# Patient Record
Sex: Female | Born: 1955 | Race: White | Hispanic: No | Marital: Single | Smoking: Former smoker
Health system: Southern US, Community
[De-identification: ages and names within clinical notes are randomized; demographics above are authoritative.]

## PROBLEM LIST (undated history)

## (undated) DIAGNOSIS — C44722 Squamous cell carcinoma of skin of right lower limb, including hip: Secondary | ICD-10-CM

## (undated) DIAGNOSIS — E785 Hyperlipidemia, unspecified: Secondary | ICD-10-CM

## (undated) DIAGNOSIS — M5430 Sciatica, unspecified side: Secondary | ICD-10-CM

## (undated) HISTORY — DX: Squamous cell carcinoma of skin of right lower limb, including hip: C44.722

## (undated) HISTORY — PX: GYNECOLOGIC CRYOSURGERY: SHX857

## (undated) HISTORY — PX: CERVICAL BIOPSY  W/ LOOP ELECTRODE EXCISION: SUR135

## (undated) HISTORY — DX: Hyperlipidemia, unspecified: E78.5

## (undated) HISTORY — DX: Sciatica, unspecified side: M54.30

---

## 1999-09-16 ENCOUNTER — Encounter: Admission: RE | Admit: 1999-09-16 | Discharge: 1999-09-16 | Payer: Self-pay | Admitting: Obstetrics and Gynecology

## 1999-09-16 ENCOUNTER — Encounter: Payer: Self-pay | Admitting: Obstetrics and Gynecology

## 2000-09-19 ENCOUNTER — Encounter: Admission: RE | Admit: 2000-09-19 | Discharge: 2000-09-19 | Payer: Self-pay | Admitting: Obstetrics and Gynecology

## 2000-09-19 ENCOUNTER — Encounter: Payer: Self-pay | Admitting: Obstetrics and Gynecology

## 2001-09-29 ENCOUNTER — Encounter: Admission: RE | Admit: 2001-09-29 | Discharge: 2001-09-29 | Payer: Self-pay | Admitting: Family Medicine

## 2001-09-29 ENCOUNTER — Encounter: Payer: Self-pay | Admitting: Family Medicine

## 2002-10-02 ENCOUNTER — Encounter: Admission: RE | Admit: 2002-10-02 | Discharge: 2002-10-02 | Payer: Self-pay | Admitting: Obstetrics and Gynecology

## 2002-10-02 ENCOUNTER — Encounter: Payer: Self-pay | Admitting: Obstetrics and Gynecology

## 2003-10-08 ENCOUNTER — Encounter: Admission: RE | Admit: 2003-10-08 | Discharge: 2003-10-08 | Payer: Self-pay | Admitting: Obstetrics and Gynecology

## 2004-10-21 ENCOUNTER — Encounter: Admission: RE | Admit: 2004-10-21 | Discharge: 2004-10-21 | Payer: Self-pay | Admitting: Family Medicine

## 2005-11-04 ENCOUNTER — Encounter: Admission: RE | Admit: 2005-11-04 | Discharge: 2005-11-04 | Payer: Self-pay | Admitting: Obstetrics and Gynecology

## 2006-11-21 ENCOUNTER — Encounter: Admission: RE | Admit: 2006-11-21 | Discharge: 2006-11-21 | Payer: Self-pay | Admitting: Obstetrics and Gynecology

## 2007-11-28 ENCOUNTER — Encounter: Admission: RE | Admit: 2007-11-28 | Discharge: 2007-11-28 | Payer: Self-pay | Admitting: Obstetrics and Gynecology

## 2008-04-10 ENCOUNTER — Ambulatory Visit: Payer: Self-pay | Admitting: Internal Medicine

## 2008-11-28 ENCOUNTER — Encounter: Admission: RE | Admit: 2008-11-28 | Discharge: 2008-11-28 | Payer: Self-pay | Admitting: Family Medicine

## 2009-12-03 ENCOUNTER — Encounter: Admission: RE | Admit: 2009-12-03 | Discharge: 2009-12-03 | Payer: Self-pay | Admitting: Family Medicine

## 2010-11-03 ENCOUNTER — Other Ambulatory Visit: Payer: Self-pay | Admitting: Family Medicine

## 2010-11-03 DIAGNOSIS — Z1231 Encounter for screening mammogram for malignant neoplasm of breast: Secondary | ICD-10-CM

## 2010-12-08 ENCOUNTER — Ambulatory Visit
Admission: RE | Admit: 2010-12-08 | Discharge: 2010-12-08 | Disposition: A | Discharge status: Home / Self Care | Payer: BC Managed Care – PPO | Source: Ambulatory Visit | Attending: Family Medicine | Admitting: Family Medicine

## 2010-12-08 DIAGNOSIS — Z1231 Encounter for screening mammogram for malignant neoplasm of breast: Secondary | ICD-10-CM

## 2011-09-27 ENCOUNTER — Other Ambulatory Visit: Payer: Self-pay | Admitting: Family Medicine

## 2011-09-27 DIAGNOSIS — Z1231 Encounter for screening mammogram for malignant neoplasm of breast: Secondary | ICD-10-CM

## 2011-11-26 ENCOUNTER — Other Ambulatory Visit: Payer: Self-pay | Admitting: Family Medicine

## 2011-11-26 DIAGNOSIS — Z780 Asymptomatic menopausal state: Secondary | ICD-10-CM

## 2011-12-15 ENCOUNTER — Ambulatory Visit: Payer: BC Managed Care – PPO

## 2011-12-16 ENCOUNTER — Ambulatory Visit: Payer: BC Managed Care – PPO

## 2011-12-17 ENCOUNTER — Other Ambulatory Visit: Payer: BC Managed Care – PPO

## 2011-12-22 ENCOUNTER — Ambulatory Visit
Admission: RE | Admit: 2011-12-22 | Discharge: 2011-12-22 | Disposition: A | Discharge status: Home / Self Care | Payer: BC Managed Care – PPO | Source: Ambulatory Visit | Attending: Family Medicine | Admitting: Family Medicine

## 2011-12-22 ENCOUNTER — Other Ambulatory Visit: Payer: BC Managed Care – PPO

## 2011-12-22 DIAGNOSIS — Z1231 Encounter for screening mammogram for malignant neoplasm of breast: Secondary | ICD-10-CM

## 2012-01-06 ENCOUNTER — Ambulatory Visit
Admission: RE | Admit: 2012-01-06 | Discharge: 2012-01-06 | Disposition: A | Discharge status: Home / Self Care | Payer: BC Managed Care – PPO | Source: Ambulatory Visit | Attending: Family Medicine | Admitting: Family Medicine

## 2012-01-06 DIAGNOSIS — Z780 Asymptomatic menopausal state: Secondary | ICD-10-CM

## 2012-10-31 ENCOUNTER — Other Ambulatory Visit: Payer: Self-pay

## 2012-10-31 DIAGNOSIS — Z1231 Encounter for screening mammogram for malignant neoplasm of breast: Secondary | ICD-10-CM

## 2012-12-27 ENCOUNTER — Ambulatory Visit
Admission: RE | Admit: 2012-12-27 | Discharge: 2012-12-27 | Disposition: A | Discharge status: Home / Self Care | Payer: BC Managed Care – PPO | Source: Ambulatory Visit

## 2012-12-27 DIAGNOSIS — Z1231 Encounter for screening mammogram for malignant neoplasm of breast: Secondary | ICD-10-CM

## 2013-11-20 ENCOUNTER — Other Ambulatory Visit: Payer: Self-pay

## 2013-11-20 DIAGNOSIS — Z1231 Encounter for screening mammogram for malignant neoplasm of breast: Secondary | ICD-10-CM

## 2013-12-28 ENCOUNTER — Ambulatory Visit: Payer: BC Managed Care – PPO

## 2013-12-31 ENCOUNTER — Ambulatory Visit: Payer: BC Managed Care – PPO

## 2014-01-10 ENCOUNTER — Ambulatory Visit
Admission: RE | Admit: 2014-01-10 | Discharge: 2014-01-10 | Disposition: A | Discharge status: Home / Self Care | Payer: BC Managed Care – PPO | Source: Ambulatory Visit

## 2014-01-10 ENCOUNTER — Encounter (INDEPENDENT_AMBULATORY_CARE_PROVIDER_SITE_OTHER): Payer: Self-pay

## 2014-01-10 DIAGNOSIS — Z1231 Encounter for screening mammogram for malignant neoplasm of breast: Secondary | ICD-10-CM

## 2014-05-11 DIAGNOSIS — E7800 Pure hypercholesterolemia, unspecified: Secondary | ICD-10-CM | POA: Insufficient documentation

## 2015-02-06 ENCOUNTER — Other Ambulatory Visit: Payer: Self-pay

## 2015-02-06 DIAGNOSIS — Z1231 Encounter for screening mammogram for malignant neoplasm of breast: Secondary | ICD-10-CM

## 2015-02-17 ENCOUNTER — Ambulatory Visit
Admission: RE | Admit: 2015-02-17 | Discharge: 2015-02-17 | Disposition: A | Discharge status: Home / Self Care | Payer: 59 | Source: Ambulatory Visit

## 2015-02-17 DIAGNOSIS — Z1231 Encounter for screening mammogram for malignant neoplasm of breast: Secondary | ICD-10-CM

## 2016-02-02 ENCOUNTER — Other Ambulatory Visit: Payer: Self-pay | Admitting: Family Medicine

## 2016-02-02 DIAGNOSIS — Z1231 Encounter for screening mammogram for malignant neoplasm of breast: Secondary | ICD-10-CM

## 2016-02-24 ENCOUNTER — Ambulatory Visit: Payer: Self-pay

## 2016-03-01 ENCOUNTER — Ambulatory Visit
Admission: RE | Admit: 2016-03-01 | Discharge: 2016-03-01 | Disposition: A | Discharge status: Home / Self Care | Payer: BLUE CROSS/BLUE SHIELD | Source: Ambulatory Visit | Attending: Family Medicine | Admitting: Family Medicine

## 2016-03-01 DIAGNOSIS — Z1231 Encounter for screening mammogram for malignant neoplasm of breast: Secondary | ICD-10-CM

## 2016-06-01 DIAGNOSIS — Z23 Encounter for immunization: Secondary | ICD-10-CM | POA: Diagnosis not present

## 2016-07-05 DIAGNOSIS — Z131 Encounter for screening for diabetes mellitus: Secondary | ICD-10-CM | POA: Diagnosis not present

## 2016-07-05 DIAGNOSIS — Z13220 Encounter for screening for lipoid disorders: Secondary | ICD-10-CM | POA: Diagnosis not present

## 2016-07-05 DIAGNOSIS — Z0000 Encounter for general adult medical examination without abnormal findings: Secondary | ICD-10-CM | POA: Diagnosis not present

## 2016-09-16 DIAGNOSIS — H25813 Combined forms of age-related cataract, bilateral: Secondary | ICD-10-CM | POA: Diagnosis not present

## 2016-10-07 DIAGNOSIS — D225 Melanocytic nevi of trunk: Secondary | ICD-10-CM | POA: Diagnosis not present

## 2016-10-07 DIAGNOSIS — L853 Xerosis cutis: Secondary | ICD-10-CM | POA: Diagnosis not present

## 2016-10-07 DIAGNOSIS — D1801 Hemangioma of skin and subcutaneous tissue: Secondary | ICD-10-CM | POA: Diagnosis not present

## 2016-10-07 DIAGNOSIS — L821 Other seborrheic keratosis: Secondary | ICD-10-CM | POA: Diagnosis not present

## 2016-11-16 DIAGNOSIS — Z13220 Encounter for screening for lipoid disorders: Secondary | ICD-10-CM | POA: Diagnosis not present

## 2016-11-22 DIAGNOSIS — Z23 Encounter for immunization: Secondary | ICD-10-CM | POA: Diagnosis not present

## 2016-12-24 DIAGNOSIS — C44722 Squamous cell carcinoma of skin of right lower limb, including hip: Secondary | ICD-10-CM

## 2016-12-24 HISTORY — DX: Squamous cell carcinoma of skin of right lower limb, including hip: C44.722

## 2017-01-25 ENCOUNTER — Other Ambulatory Visit: Payer: Self-pay | Admitting: Family Medicine

## 2017-01-25 DIAGNOSIS — R5381 Other malaise: Secondary | ICD-10-CM

## 2017-01-31 ENCOUNTER — Other Ambulatory Visit: Payer: Self-pay | Admitting: Family Medicine

## 2017-01-31 DIAGNOSIS — E2839 Other primary ovarian failure: Secondary | ICD-10-CM

## 2017-01-31 DIAGNOSIS — Z1231 Encounter for screening mammogram for malignant neoplasm of breast: Secondary | ICD-10-CM

## 2017-02-03 DIAGNOSIS — L989 Disorder of the skin and subcutaneous tissue, unspecified: Secondary | ICD-10-CM | POA: Diagnosis not present

## 2017-02-16 DIAGNOSIS — C44722 Squamous cell carcinoma of skin of right lower limb, including hip: Secondary | ICD-10-CM | POA: Diagnosis not present

## 2017-02-16 DIAGNOSIS — D225 Melanocytic nevi of trunk: Secondary | ICD-10-CM | POA: Diagnosis not present

## 2017-03-08 ENCOUNTER — Ambulatory Visit
Admission: RE | Admit: 2017-03-08 | Discharge: 2017-03-08 | Disposition: A | Discharge status: Home / Self Care | Payer: BLUE CROSS/BLUE SHIELD | Source: Ambulatory Visit | Attending: Family Medicine | Admitting: Family Medicine

## 2017-03-08 DIAGNOSIS — Z13820 Encounter for screening for osteoporosis: Secondary | ICD-10-CM | POA: Diagnosis not present

## 2017-03-08 DIAGNOSIS — E2839 Other primary ovarian failure: Secondary | ICD-10-CM

## 2017-03-08 DIAGNOSIS — Z1231 Encounter for screening mammogram for malignant neoplasm of breast: Secondary | ICD-10-CM | POA: Diagnosis not present

## 2017-03-08 DIAGNOSIS — Z780 Asymptomatic menopausal state: Secondary | ICD-10-CM | POA: Diagnosis not present

## 2017-03-23 DIAGNOSIS — Z23 Encounter for immunization: Secondary | ICD-10-CM | POA: Diagnosis not present

## 2017-05-05 ENCOUNTER — Encounter: Payer: Self-pay | Admitting: Obstetrics & Gynecology

## 2017-05-19 ENCOUNTER — Ambulatory Visit (INDEPENDENT_AMBULATORY_CARE_PROVIDER_SITE_OTHER): Payer: BLUE CROSS/BLUE SHIELD | Admitting: Obstetrics & Gynecology

## 2017-05-19 ENCOUNTER — Encounter: Payer: Self-pay | Admitting: Obstetrics & Gynecology

## 2017-05-19 ENCOUNTER — Other Ambulatory Visit (HOSPITAL_COMMUNITY)
Admission: RE | Admit: 2017-05-19 | Discharge: 2017-05-19 | Disposition: A | Discharge status: Home / Self Care | Payer: BLUE CROSS/BLUE SHIELD | Source: Ambulatory Visit | Attending: Obstetrics & Gynecology | Admitting: Obstetrics & Gynecology

## 2017-05-19 VITALS — BP 110/70 | HR 82 | Resp 16 | Ht 63.25 in | Wt 140.0 lb

## 2017-05-19 DIAGNOSIS — Z01419 Encounter for gynecological examination (general) (routine) without abnormal findings: Secondary | ICD-10-CM

## 2017-05-19 DIAGNOSIS — Z124 Encounter for screening for malignant neoplasm of cervix: Secondary | ICD-10-CM

## 2017-05-19 DIAGNOSIS — Z23 Encounter for immunization: Secondary | ICD-10-CM | POA: Diagnosis not present

## 2017-05-19 NOTE — Progress Notes (Signed)
61 y.o. [id number] SingleCaucasianF here for new patient annual exam.  PMP, no HRT.  Hasn't cycled in at least five years.  Saw Dr. Kris Mouton in [facility] before he retired.  Denies vaginal bleeding.  PCP: Dr. Tamala Julian.  Last appt was in February  No LMP recorded. Patient is postmenopausal.          Sexually active: Yes.    The current method of family planning is post menopausal status.    Exercising: No.  walking, yoga Smoker:  no  Health Maintenance: Pap: 11/2013 Neg. HR HPV:neg  - Care Everywhere (pt thought she had Pap done more recently) History of abnormal Pap:  Yes, LEEP  MMG:  03/08/17 BIRADS1:neg  Colonoscopy:  2008? Normal  BMD:  03/08/17 Normal  TDaP:  2013? Pneumonia vaccine(s):  No Shingrix:  4/30 and 7/14 Hep C testing: No Screening Labs: PCP   reports that she quit smoking about 36 years ago. Her smoking use included Cigarettes. She has never used smokeless tobacco. She reports that she does not drink alcohol.  Past Medical History:  Diagnosis Date  . 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 CERVICAL BIOPSY  W/ LOOP ELECTRODE EXCISION    . GYNECOLOGIC CRYOSURGERY      Current Outpatient Prescriptions  Medication Sig Dispense Refill  . Co-Enzyme Q-10 30 MG CAPS Take 30 mg by mouth daily.    . Red Yeast Rice Extract (RED YEAST RICE PO) Take by mouth daily.     No current facility-administered medications for this visit.     Family History  Problem Relation Age of Onset  . Heart attack Mother   . Stroke Father   . Breast cancer Neg Hx     ROS:  Pertinent items are noted in HPI.  Otherwise, a comprehensive ROS was negative.  Exam:   BP 110/70 (BP Location: Right Arm, Patient Position: Sitting, Cuff Size: Normal)   Pulse 82   Resp 16   Ht 5' 3.25" (1.607 m)   Wt 140 lb (63.5 kg)   BMI 24.60 kg/m  Height: 5' 3.25" (160.7 cm)  Ht Readings from Last 3 Encounters:  05/19/17 5' 3.25" (1.607 m)     General appearance: alert, cooperative and appears stated age Head: Normocephalic, without obvious abnormality, atraumatic Neck: no adenopathy, supple, symmetrical, trachea midline and thyroid normal to inspection and palpation Lungs: clear to auscultation bilaterally Breasts: normal appearance, no masses or tenderness Heart: regular rate and rhythm Abdomen: soft, non-tender; bowel sounds normal; no masses,  no organomegaly Extremities: extremities normal, atraumatic, no cyanosis or edema Skin: Skin color, texture, turgor normal. No rashes or lesions Lymph nodes: Cervical, supraclavicular, and axillary nodes normal. No abnormal inguinal nodes palpated Neurologic: Grossly normal   Pelvic: External genitalia:  no lesions              Urethra:  normal appearing urethra with no masses, tenderness or lesions              Bartholins and Skenes: normal                 Vagina: normal appearing vagina with normal color and discharge, no lesions              Cervix: no lesions              Pap taken: Yes.   Bimanual Exam:  Uterus:  normal size, contour, position, consistency, mobility,  non-tender              Adnexa: normal adnexa and no mass, fullness, tenderness               Rectovaginal: Confirms               Anus:  normal sphincter tone, no lesions  Chaperone was present for exam.  A:  Well Woman with normal exam PMP, no HRT Elevated lipids, declined statin treatment currently  P:   Mammogram guidelines reviewed.  3D recommended.   pap smear and HR HPV obtained today Release of records for last lab work and vaccines will be signed Pt is going to check about colonoscopy as if her dates are correct, she is due this year Return annually or prn

## 2017-05-24 LAB — CYTOLOGY - PAP
Diagnosis: UNDETERMINED — AB
HPV: NOT DETECTED

## 2017-05-26 ENCOUNTER — Telehealth: Payer: Self-pay | Admitting: *Deleted

## 2017-05-26 NOTE — Telephone Encounter (Signed)
Patient returned call. Results reviewed with patient as seen below from Dr. Sabra Heck. Patient has aex scheduled for 08/17/18.   Patient agreeable to disposition. Will close encounter.

## 2017-05-26 NOTE — Telephone Encounter (Signed)
-----   Message from Megan Salon, MD sent at 05/25/2017  6:46 AM EDT ----- Please call pt and let her know her pap was ascus with negative HR HPV and that although this doesn't sound normal, it actually is a normal pap smear.  I will repeat this next year and not skip a year just to be conservative with following her.  She is on my chart but desires phone calls about her results.  02 recall.  Thanks.

## 2017-05-26 NOTE — Telephone Encounter (Signed)
Message left to return call to Cyndel Griffey at [phone].    

## 2017-06-30 DIAGNOSIS — E7800 Pure hypercholesterolemia, unspecified: Secondary | ICD-10-CM | POA: Diagnosis not present

## 2017-06-30 DIAGNOSIS — Z0000 Encounter for general adult medical examination without abnormal findings: Secondary | ICD-10-CM | POA: Diagnosis not present

## 2017-07-22 DIAGNOSIS — H40053 Ocular hypertension, bilateral: Secondary | ICD-10-CM | POA: Diagnosis not present

## 2017-08-01 DIAGNOSIS — J410 Simple chronic bronchitis: Secondary | ICD-10-CM | POA: Diagnosis not present

## 2017-08-01 DIAGNOSIS — H6522 Chronic serous otitis media, left ear: Secondary | ICD-10-CM | POA: Diagnosis not present

## 2017-08-01 DIAGNOSIS — J322 Chronic ethmoidal sinusitis: Secondary | ICD-10-CM | POA: Diagnosis not present

## 2017-08-01 DIAGNOSIS — J320 Chronic maxillary sinusitis: Secondary | ICD-10-CM | POA: Diagnosis not present

## 2017-08-01 DIAGNOSIS — H90A32 Mixed conductive and sensorineural hearing loss, unilateral, left ear with restricted hearing on the contralateral side: Secondary | ICD-10-CM | POA: Diagnosis not present

## 2017-08-01 DIAGNOSIS — H90A21 Sensorineural hearing loss, unilateral, right ear, with restricted hearing on the contralateral side: Secondary | ICD-10-CM | POA: Diagnosis not present

## 2017-08-12 DIAGNOSIS — H9012 Conductive hearing loss, unilateral, left ear, with unrestricted hearing on the contralateral side: Secondary | ICD-10-CM | POA: Diagnosis not present

## 2017-08-12 DIAGNOSIS — H6982 Other specified disorders of Eustachian tube, left ear: Secondary | ICD-10-CM | POA: Diagnosis not present

## 2017-11-17 DIAGNOSIS — E7800 Pure hypercholesterolemia, unspecified: Secondary | ICD-10-CM | POA: Diagnosis not present

## 2017-11-24 DIAGNOSIS — D2239 Melanocytic nevi of other parts of face: Secondary | ICD-10-CM | POA: Diagnosis not present

## 2017-11-24 DIAGNOSIS — D2261 Melanocytic nevi of right upper limb, including shoulder: Secondary | ICD-10-CM | POA: Diagnosis not present

## 2017-11-24 DIAGNOSIS — Z85828 Personal history of other malignant neoplasm of skin: Secondary | ICD-10-CM | POA: Diagnosis not present

## 2017-11-24 DIAGNOSIS — D225 Melanocytic nevi of trunk: Secondary | ICD-10-CM | POA: Diagnosis not present

## 2018-01-16 DIAGNOSIS — J209 Acute bronchitis, unspecified: Secondary | ICD-10-CM | POA: Diagnosis not present

## 2018-01-30 ENCOUNTER — Telehealth: Payer: Self-pay | Admitting: Obstetrics & Gynecology

## 2018-01-30 NOTE — Telephone Encounter (Signed)
Patient thinks she may have an infection °

## 2018-01-30 NOTE — Telephone Encounter (Signed)
Spoke with patient. Reports intermittent, thin, watery d/c. Symptoms started last week while in [place]. No new products. Denies any other GYN symptoms.  Recommended OV for further evaluation, OV scheduled for 4/9 at 9am with Dr. Sabra Heck.   Routing to provider for final review. Patient is agreeable to disposition. Will close encounter.

## 2018-01-31 ENCOUNTER — Telehealth: Payer: Self-pay | Admitting: Obstetrics & Gynecology

## 2018-01-31 ENCOUNTER — Encounter: Payer: Self-pay | Admitting: Obstetrics & Gynecology

## 2018-01-31 ENCOUNTER — Other Ambulatory Visit: Payer: Self-pay

## 2018-01-31 ENCOUNTER — Ambulatory Visit (INDEPENDENT_AMBULATORY_CARE_PROVIDER_SITE_OTHER): Payer: BLUE CROSS/BLUE SHIELD | Admitting: Obstetrics & Gynecology

## 2018-01-31 VITALS — BP 120/60 | HR 88 | Resp 14 | Ht 63.25 in | Wt 140.8 lb

## 2018-01-31 DIAGNOSIS — Z205 Contact with and (suspected) exposure to viral hepatitis: Secondary | ICD-10-CM

## 2018-01-31 DIAGNOSIS — N898 Other specified noninflammatory disorders of vagina: Secondary | ICD-10-CM

## 2018-01-31 NOTE — Progress Notes (Signed)
GYNECOLOGY  VISIT  CC:   Possible vaginitis  HPI: 62 y.o. [phone] Single Caucasian female here for complaint of copious vaginal discharge that started while she was on a [place] cruise about two weeks ago.  She did have some pads with her on the trip and needed to use pads because the discharge was so watery.  She's not had any bleeding.  At times, she wondered if was urine but she's fairly confident that she is not having urinary leakage.    She took Amoxicillin for 10 days before her [place] cruise due to a URI.  Reports she has to come back for Hep C testing.  Insurance requires this to be a separate appt.  Orders placed.  GYNECOLOGIC HISTORY: No LMP recorded. Patient is postmenopausal. Contraception: post menopausal  Menopausal hormone therapy: none  Patient Active Problem List   Diagnosis Date Noted  . Hypercholesteremia 05/11/2014    Past Medical History:  Diagnosis Date  . 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 CERVICAL BIOPSY  W/ LOOP ELECTRODE EXCISION    . GYNECOLOGIC CRYOSURGERY      MEDS:   Current Outpatient Medications on File Prior to Visit  Medication Sig Dispense Refill  . Co-Enzyme Q-10 30 MG CAPS Take 30 mg by mouth daily.    . Multiple Vitamin (MULTIVITAMIN) capsule Take by mouth daily.    . Red Yeast Rice Extract (RED YEAST RICE PO) Take by mouth daily.     No current facility-administered medications on file prior to visit.     ALLERGIES: Patient has no known allergies.  Family History  Problem Relation Age of Onset  . Heart attack Mother   . Stroke Father   . Breast cancer Neg Hx     SH:  Married, non smoker  Review of Systems  Genitourinary:       Abnormal discharge Loss of urine spontaneously  Loss of urine with sneeze or cough   All other systems reviewed and are negative.   PHYSICAL EXAMINATION:    BP 120/60 (BP Location: Right Arm, Patient Position: Sitting, Cuff  Size: Normal)   Pulse 88   Resp 14   Ht 5' 3.25" (1.607 m)   Wt 140 lb 12.8 oz (63.9 kg)   BMI 24.74 kg/m     General appearance: alert, cooperative and appears stated age Lymph:  No inguinal LAD  Pelvic: External genitalia:  no lesions              Urethra:  normal appearing urethra with no masses, tenderness or lesions              Bartholins and Skenes: normal                 Vagina: normal appearing vagina with normal color and discharge, no lesions              Cervix: no lesions              Bimanual Exam:  Uterus:  normal size, contour, position, consistency, mobility, non-tender              Adnexa: no mass, fullness, tenderness.              Anus:  normal sphincter tone  Chaperone was present for exam.  Assessment: Vaginal discharge  Plan: Affirm testing pending.  She wants to wait for the results before proceeding with treatment. Hep C  antibody ordered.  Her insurance requires her to return for just lab work.

## 2018-01-31 NOTE — Telephone Encounter (Signed)
Patient declined to schedule her lab appointment for Hep C anti-body at checkout today. She said she will call to schedule.  Routing to provider for FYI. Will keep encounter open to monitor scheduling.

## 2018-02-01 ENCOUNTER — Telehealth: Payer: Self-pay | Admitting: Obstetrics & Gynecology

## 2018-02-01 DIAGNOSIS — N898 Other specified noninflammatory disorders of vagina: Secondary | ICD-10-CM

## 2018-02-01 LAB — VAGINITIS/VAGINOSIS, DNA PROBE
CANDIDA SPECIES: NEGATIVE
Gardnerella vaginalis: NEGATIVE
Trichomonas vaginosis: NEGATIVE

## 2018-02-01 NOTE — Telephone Encounter (Signed)
Spoke with patient. Patient requesting vaginitis test results dated 7/9, is leaving to go out of town.   Advised patient negative for yeast, BV and trichomonas. Dr. Sabra Heck still needs to review and make final recommendations. Advised Dr. Sabra Heck is out of the office today, will return on 7/11. Our office will return call once reviewed. Patient agreeable.   Dr. Sabra Heck -please advise.

## 2018-02-01 NOTE — Telephone Encounter (Signed)
Spoke with patient, advised as seen below per Dr. Sabra Heck. PUS scheduled for 02/09/18 at 2pm, consult to follow at 2:30pm with Dr. Sabra Heck. Patient verbalizes understanding and is agreeable.   Order placed for PUS.   Encounter closed.

## 2018-02-01 NOTE — Telephone Encounter (Signed)
We discussed at the [facility] that she should proceed with a PUS if the vaginitis testing was negative.  She is going to [facility] so will need to do this when she returns.  Thanks.

## 2018-02-01 NOTE — Telephone Encounter (Signed)
Patient is calling for her recent lab results. Patient was seen yesterday and "is going out of town and would like to pick up her medication".

## 2018-02-07 ENCOUNTER — Other Ambulatory Visit: Payer: Self-pay | Admitting: Obstetrics & Gynecology

## 2018-02-07 DIAGNOSIS — Z1231 Encounter for screening mammogram for malignant neoplasm of breast: Secondary | ICD-10-CM

## 2018-02-09 ENCOUNTER — Ambulatory Visit (INDEPENDENT_AMBULATORY_CARE_PROVIDER_SITE_OTHER): Payer: BLUE CROSS/BLUE SHIELD

## 2018-02-09 ENCOUNTER — Ambulatory Visit (INDEPENDENT_AMBULATORY_CARE_PROVIDER_SITE_OTHER): Payer: BLUE CROSS/BLUE SHIELD | Admitting: Obstetrics & Gynecology

## 2018-02-09 VITALS — BP 130/80 | HR 92 | Resp 16 | Ht 63.25 in | Wt 140.0 lb

## 2018-02-09 DIAGNOSIS — N898 Other specified noninflammatory disorders of vagina: Secondary | ICD-10-CM

## 2018-02-09 NOTE — Progress Notes (Signed)
62 y.o. [phone] Single Caucasian female here for pelvic ultrasound due to copious amount of clear vaginal discharge that occurred while she was in [place] on a riverboat cruise.  Vaginitis testing was negative.  As this dicharge was significant, I felt PUS was appropriate.  Discharge has significantly improved.   She was recently in [place] and has just received notice that she may have been exposed to a bacteria while there.  This notification came from the hotel.  Up to date recommendations given.  No prophylaxis is recommended but signs/symptoms reviewed.  Incubation period reviewed as well..  No LMP recorded. Patient is postmenopausal.  Contraception: PMP  Findings:  UTERUS: 5.3 x 3.9 x 2.6cm EMS:2.52mm ADNEXA: Left ovary:  1.1 x 0.9 x 0.6cm       Right ovary: 1.6 x 1.0 x 0.7cm (ovaries were difficult to visualize due to overlying bowel gas) CUL DE SAC:  No free fluid noted  Discussion:  Findings reviewed.  Images reviewed.  As symptoms are much better, do not feel she needs anything at this time for treatment.  She is going to call if has new onset of symptoms.  As well, in regards to possible bacteria exposure, she will monitor for signs and symptoms and call with any concerns..  Assessment:  Vaginal discharge that seems to have resolved  Plan:  Pt will call with any new concerns/symptoms.   ~15 minutes spent with patient >50% of time was in face to face discussion of above.

## 2018-02-12 ENCOUNTER — Encounter: Payer: Self-pay | Admitting: Obstetrics & Gynecology

## 2018-03-13 ENCOUNTER — Ambulatory Visit
Admission: RE | Admit: 2018-03-13 | Discharge: 2018-03-13 | Disposition: A | Discharge status: Home / Self Care | Payer: BLUE CROSS/BLUE SHIELD | Source: Ambulatory Visit | Attending: Obstetrics & Gynecology | Admitting: Obstetrics & Gynecology

## 2018-03-13 ENCOUNTER — Ambulatory Visit: Payer: BLUE CROSS/BLUE SHIELD

## 2018-03-13 DIAGNOSIS — Z1231 Encounter for screening mammogram for malignant neoplasm of breast: Secondary | ICD-10-CM

## 2018-03-30 ENCOUNTER — Telehealth: Payer: Self-pay | Admitting: Obstetrics & Gynecology

## 2018-03-30 NOTE — Telephone Encounter (Signed)
Patient has some questions about screening for hepatitis.

## 2018-03-30 NOTE — Telephone Encounter (Signed)
Pt would like to schedule hep C screening. Per patient Insurance does not cover this screening with an office visit. Needs lab visit only.  Orders have been placed by Dr. Sabra Heck.  Lab appointment scheduled for tomorrow morning, not fasting.  Encounter closed.

## 2018-03-31 ENCOUNTER — Other Ambulatory Visit (INDEPENDENT_AMBULATORY_CARE_PROVIDER_SITE_OTHER): Payer: BLUE CROSS/BLUE SHIELD

## 2018-03-31 DIAGNOSIS — Z205 Contact with and (suspected) exposure to viral hepatitis: Secondary | ICD-10-CM

## 2018-04-01 LAB — HEPATITIS C ANTIBODY: Hep C Virus Ab: 0.1 s/co ratio (ref 0.0–0.9)

## 2018-06-26 ENCOUNTER — Encounter: Payer: Self-pay | Admitting: Obstetrics & Gynecology

## 2018-06-26 ENCOUNTER — Ambulatory Visit (INDEPENDENT_AMBULATORY_CARE_PROVIDER_SITE_OTHER): Payer: BLUE CROSS/BLUE SHIELD | Admitting: Obstetrics & Gynecology

## 2018-06-26 ENCOUNTER — Other Ambulatory Visit (HOSPITAL_COMMUNITY)
Admission: RE | Admit: 2018-06-26 | Discharge: 2018-06-26 | Disposition: A | Discharge status: Home / Self Care | Payer: BLUE CROSS/BLUE SHIELD | Source: Ambulatory Visit | Attending: Obstetrics & Gynecology | Admitting: Obstetrics & Gynecology

## 2018-06-26 ENCOUNTER — Encounter

## 2018-06-26 VITALS — BP 136/74 | HR 88 | Resp 16 | Ht 63.0 in | Wt 142.2 lb

## 2018-06-26 DIAGNOSIS — Z124 Encounter for screening for malignant neoplasm of cervix: Secondary | ICD-10-CM | POA: Insufficient documentation

## 2018-06-26 DIAGNOSIS — Z01419 Encounter for gynecological examination (general) (routine) without abnormal findings: Secondary | ICD-10-CM

## 2018-06-26 NOTE — Progress Notes (Signed)
62 y.o. [id number] Single White or Caucasian female here for annual exam.  Denies vaginal bleeding.  Doing GI vitamin regimen.  Doing well with vitamin regimen.  Urinary function is normal.    Did get a flu shot.    PCP:  Having blood work done with Dr. Tamala Julian.  No LMP recorded. Patient is postmenopausal.          Sexually active: No.  The current method of family planning is post menopausal status.    Exercising: Yes.    walk Smoker:  no  Health Maintenance: Pap:  05/19/17 ASCUS. HR HPV:neg  History of abnormal Pap:  Yes, LEEP MMG:  03/13/18 BIRADS1:neg  Colonoscopy:  2008?  She is going to check with insurance about what practice is in her system.   BMD:   03/08/17 Normal  TDaP:  2013 Pneumonia vaccine(s):  No Shingrix:   Completed  Hep C testing: 03/31/18 Neg  Screening Labs: PCP   reports that she quit smoking about 37 years ago. Her smoking use included cigarettes. She has never used smokeless tobacco. She reports that she drinks about 5.0 standard drinks of alcohol per week. She reports that she does not use drugs.  Past Medical History:  Diagnosis Date  . 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 CERVICAL BIOPSY  W/ LOOP ELECTRODE EXCISION    . GYNECOLOGIC CRYOSURGERY      Current Outpatient Medications  Medication Sig Dispense Refill  . Co-Enzyme Q-10 30 MG CAPS Take 30 mg by mouth daily.    . diphenhydrAMINE HCl (ALLERGY MED PO) Take by mouth daily.    . fluticasone (FLONASE) 50 MCG/ACT nasal spray Place into both nostrils daily.    . Magnesium 400 MG CAPS Take by mouth daily.    . Multiple Vitamin (MULTIVITAMIN) capsule Take by mouth daily.    . Omega-3 Fatty Acids (KP FISH OIL) 1200 MG CAPS Take by mouth daily.     No current facility-administered medications for this visit.     Family History  Problem Relation Age of Onset  . Heart attack Mother   . Stroke Father   . Breast cancer Neg Hx     Review  of Systems  Genitourinary:       Loss of urine with sneeze or cough  Loss of urine spontaneously   All other systems reviewed and are negative.   Exam:   BP 136/74 (BP Location: Right Arm, Patient Position: Sitting, Cuff Size: Normal)   Pulse 88   Resp 16   Ht 5\' 3"  (1.6 m)   Wt 142 lb 3.2 oz (64.5 kg)   BMI 25.19 kg/m    Height: 5\' 3"  (160 cm)  Ht Readings from Last 3 Encounters:  06/26/18 5\' 3"  (1.6 m)  02/09/18 5' 3.25" (1.607 m)  01/31/18 5' 3.25" (1.607 m)    General appearance: alert, cooperative and appears stated age Head: Normocephalic, without obvious abnormality, atraumatic Neck: no adenopathy, supple, symmetrical, trachea midline and thyroid normal to inspection and palpation Lungs: clear to auscultation bilaterally Breasts: normal appearance, no masses or tenderness Heart: regular rate and rhythm Abdomen: soft, non-tender; bowel sounds normal; no masses,  no organomegaly Extremities: extremities normal, atraumatic, no cyanosis or edema Skin: Skin color, texture, turgor normal. No rashes or lesions Lymph nodes: Cervical, supraclavicular, and axillary nodes normal. No abnormal inguinal nodes palpated Neurologic: Grossly normal   Pelvic: External genitalia:  no lesions  Urethra:  normal appearing urethra with no masses, tenderness or lesions              Bartholins and Skenes: normal                 Vagina: normal appearing vagina with normal color and discharge, no lesions              Cervix: no lesions              Pap taken: Yes.   Bimanual Exam:  Uterus:  normal size, contour, position, consistency, mobility, non-tender              Adnexa: normal adnexa and no mass, fullness, tenderness               Rectovaginal: Confirms               Anus:  normal sphincter tone, no lesions  Chaperone was present for exam.  A:  Well Woman with normal exam PMP, on HRT H/o elevated lipids, followed by Dr. Tamala Julian  P:   Mammogram guidelines reviewed.   Doing 3D.  pap smear obtained today Aware colonoscopy is due.  She is going to check who is in-network for her and let me know Will see Dr Tamala Julian next week and have blood work done.  Vaccines are UTD. Return annually or prn

## 2018-06-28 LAB — CYTOLOGY - PAP: DIAGNOSIS: NEGATIVE

## 2018-07-03 DIAGNOSIS — E7800 Pure hypercholesterolemia, unspecified: Secondary | ICD-10-CM | POA: Diagnosis not present

## 2018-07-03 DIAGNOSIS — Z0000 Encounter for general adult medical examination without abnormal findings: Secondary | ICD-10-CM | POA: Diagnosis not present

## 2018-08-17 ENCOUNTER — Ambulatory Visit: Payer: BLUE CROSS/BLUE SHIELD | Admitting: Obstetrics & Gynecology

## 2018-08-17 DIAGNOSIS — Z01818 Encounter for other preprocedural examination: Secondary | ICD-10-CM | POA: Diagnosis not present

## 2018-08-17 DIAGNOSIS — Z1211 Encounter for screening for malignant neoplasm of colon: Secondary | ICD-10-CM | POA: Diagnosis not present

## 2018-09-04 DIAGNOSIS — S83242A Other tear of medial meniscus, current injury, left knee, initial encounter: Secondary | ICD-10-CM | POA: Diagnosis not present

## 2018-09-04 DIAGNOSIS — M2342 Loose body in knee, left knee: Secondary | ICD-10-CM | POA: Diagnosis not present

## 2018-09-08 DIAGNOSIS — M25562 Pain in left knee: Secondary | ICD-10-CM | POA: Diagnosis not present

## 2018-09-27 DIAGNOSIS — E7800 Pure hypercholesterolemia, unspecified: Secondary | ICD-10-CM | POA: Diagnosis not present

## 2018-10-12 DIAGNOSIS — K5730 Diverticulosis of large intestine without perforation or abscess without bleeding: Secondary | ICD-10-CM | POA: Diagnosis not present

## 2018-10-12 DIAGNOSIS — D124 Benign neoplasm of descending colon: Secondary | ICD-10-CM | POA: Diagnosis not present

## 2018-10-12 DIAGNOSIS — Z1211 Encounter for screening for malignant neoplasm of colon: Secondary | ICD-10-CM | POA: Diagnosis not present

## 2018-10-12 DIAGNOSIS — D125 Benign neoplasm of sigmoid colon: Secondary | ICD-10-CM | POA: Diagnosis not present

## 2018-10-12 DIAGNOSIS — D123 Benign neoplasm of transverse colon: Secondary | ICD-10-CM | POA: Diagnosis not present

## 2018-10-17 DIAGNOSIS — D124 Benign neoplasm of descending colon: Secondary | ICD-10-CM | POA: Diagnosis not present

## 2018-10-17 DIAGNOSIS — D125 Benign neoplasm of sigmoid colon: Secondary | ICD-10-CM | POA: Diagnosis not present

## 2018-10-17 DIAGNOSIS — D123 Benign neoplasm of transverse colon: Secondary | ICD-10-CM | POA: Diagnosis not present

## 2018-12-27 DIAGNOSIS — L738 Other specified follicular disorders: Secondary | ICD-10-CM | POA: Diagnosis not present

## 2018-12-27 DIAGNOSIS — Z85828 Personal history of other malignant neoplasm of skin: Secondary | ICD-10-CM | POA: Diagnosis not present

## 2018-12-27 DIAGNOSIS — D225 Melanocytic nevi of trunk: Secondary | ICD-10-CM | POA: Diagnosis not present

## 2018-12-27 DIAGNOSIS — D1801 Hemangioma of skin and subcutaneous tissue: Secondary | ICD-10-CM | POA: Diagnosis not present

## 2019-02-16 ENCOUNTER — Other Ambulatory Visit: Payer: Self-pay | Admitting: Obstetrics & Gynecology

## 2019-02-16 DIAGNOSIS — Z1231 Encounter for screening mammogram for malignant neoplasm of breast: Secondary | ICD-10-CM

## 2019-04-11 ENCOUNTER — Other Ambulatory Visit: Payer: Self-pay

## 2019-04-11 ENCOUNTER — Ambulatory Visit
Admission: RE | Admit: 2019-04-11 | Discharge: 2019-04-11 | Disposition: A | Discharge status: Home / Self Care | Payer: BC Managed Care – PPO | Source: Ambulatory Visit | Attending: Obstetrics & Gynecology | Admitting: Obstetrics & Gynecology

## 2019-04-11 DIAGNOSIS — Z1231 Encounter for screening mammogram for malignant neoplasm of breast: Secondary | ICD-10-CM | POA: Diagnosis not present

## 2019-06-20 DIAGNOSIS — Z20828 Contact with and (suspected) exposure to other viral communicable diseases: Secondary | ICD-10-CM | POA: Diagnosis not present

## 2019-06-20 DIAGNOSIS — Z9189 Other specified personal risk factors, not elsewhere classified: Secondary | ICD-10-CM | POA: Diagnosis not present

## 2019-07-09 DIAGNOSIS — Z0000 Encounter for general adult medical examination without abnormal findings: Secondary | ICD-10-CM | POA: Diagnosis not present

## 2019-07-17 DIAGNOSIS — Z0000 Encounter for general adult medical examination without abnormal findings: Secondary | ICD-10-CM | POA: Diagnosis not present

## 2019-07-17 DIAGNOSIS — E7800 Pure hypercholesterolemia, unspecified: Secondary | ICD-10-CM | POA: Diagnosis not present

## 2019-08-02 ENCOUNTER — Other Ambulatory Visit: Payer: Self-pay

## 2019-08-06 ENCOUNTER — Other Ambulatory Visit (HOSPITAL_COMMUNITY)
Admission: RE | Admit: 2019-08-06 | Discharge: 2019-08-06 | Disposition: A | Discharge status: Home / Self Care | Payer: BC Managed Care – PPO | Source: Ambulatory Visit | Attending: Obstetrics & Gynecology | Admitting: Obstetrics & Gynecology

## 2019-08-06 ENCOUNTER — Other Ambulatory Visit: Payer: Self-pay

## 2019-08-06 ENCOUNTER — Encounter: Payer: Self-pay | Admitting: Obstetrics & Gynecology

## 2019-08-06 ENCOUNTER — Ambulatory Visit (INDEPENDENT_AMBULATORY_CARE_PROVIDER_SITE_OTHER): Payer: BC Managed Care – PPO | Admitting: Obstetrics & Gynecology

## 2019-08-06 VITALS — BP 116/60 | HR 80 | Temp 97.5°F | Resp 12 | Ht 63.5 in | Wt 140.4 lb

## 2019-08-06 DIAGNOSIS — Z01419 Encounter for gynecological examination (general) (routine) without abnormal findings: Secondary | ICD-10-CM | POA: Diagnosis not present

## 2019-08-06 DIAGNOSIS — R87610 Atypical squamous cells of undetermined significance on cytologic smear of cervix (ASC-US): Secondary | ICD-10-CM | POA: Diagnosis not present

## 2019-08-06 DIAGNOSIS — Z124 Encounter for screening for malignant neoplasm of cervix: Secondary | ICD-10-CM | POA: Diagnosis not present

## 2019-08-06 DIAGNOSIS — Z01411 Encounter for gynecological examination (general) (routine) with abnormal findings: Secondary | ICD-10-CM | POA: Diagnosis not present

## 2019-08-06 DIAGNOSIS — R87612 Low grade squamous intraepithelial lesion on cytologic smear of cervix (LGSIL): Secondary | ICD-10-CM | POA: Insufficient documentation

## 2019-08-06 DIAGNOSIS — Z1151 Encounter for screening for human papillomavirus (HPV): Secondary | ICD-10-CM | POA: Insufficient documentation

## 2019-08-06 NOTE — Progress Notes (Signed)
64 y.o. [id number] Single White or Caucasian female here for annual exam.  Doing well.  Denies vaginal bleeding.  Having some urinary urgency that is intermittent and this sometimes causes her to have some urinary leakage.  Discussed pelvic PT.  PCP:  Dr. Tamala Julian.  Had blood work done in December.  Pt reports this was normal except for one enzyme that was elevated.  Needs recheck 6 weeks and has this appt scheduled.   No LMP recorded. Patient is postmenopausal.          Sexually active: No.  The current method of family planning is post menopausal status.    Exercising: No.  The patient does not participate in regular exercise at present. Smoker:  no  Health Maintenance: Pap:   06/26/18 Neg  05/19/17 ASCUS. HR HPV:neg  History of abnormal Pap:  Yes, LEEP MMG:  04/11/19 BIRADS 1 negative/density c Colonoscopy:  March 2020 polyps removed, Dr. Paulita Fujita BMD:   03/08/17 Normal  TDaP:  11/11/2011 Pneumonia vaccine(s):  no Shingrix:   completed Hep C testing: 03/31/18 Neg Screening Labs: PCP   reports that she quit smoking about 39 years ago. Her smoking use included cigarettes. She has never used smokeless tobacco. She reports current alcohol use of about 5.0 standard drinks of alcohol per week. She reports that she does not use drugs.  Past Medical History:  Diagnosis Date  . 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 CERVICAL BIOPSY  W/ LOOP ELECTRODE EXCISION    . GYNECOLOGIC CRYOSURGERY      Current Outpatient Medications  Medication Sig Dispense Refill  . Ascorbic Acid (VITAMIN C) 1000 MG tablet Take 1,000 mg by mouth daily.    . Cholecalciferol (VITAMIN D3 PO) Take 1,000 Units by mouth.    Marland Kitchen Co-Enzyme Q-10 30 MG CAPS Take 30 mg by mouth daily.    . fluticasone (FLONASE) 50 MCG/ACT nasal spray Place into both nostrils daily.    . Multiple Vitamin (MULTIVITAMIN) capsule Take by mouth daily.    . rosuvastatin (CRESTOR) 10 MG tablet  Take 10 mg by mouth daily.    . Zinc 50 MG TABS Take by mouth.    . diphenhydrAMINE HCl (ALLERGY MED PO) Take by mouth daily.     No current facility-administered medications for this visit.    Family History  Problem Relation Age of Onset  . Heart attack Mother   . Stroke Father   . Breast cancer Neg Hx     Review of Systems  All other systems reviewed and are negative.   Exam:   BP 116/60 (BP Location: Right Arm, Patient Position: Sitting, Cuff Size: Normal)   Pulse 80   Temp (!) 97.5 F (36.4 C) (Temporal)   Resp 12   Ht 5' 3.5" (1.613 m)   Wt 140 lb 6.4 oz (63.7 kg)   BMI 24.48 kg/m   Height: 5' 3.5" (161.3 cm)  Ht Readings from Last 3 Encounters:  08/06/19 5' 3.5" (1.613 m)  06/26/18 5\' 3"  (1.6 m)  02/09/18 5' 3.25" (1.607 m)   General appearance: alert, cooperative and appears stated age Head: Normocephalic, without obvious abnormality, atraumatic Neck: no adenopathy, supple, symmetrical, trachea midline and thyroid normal to inspection and palpation Lungs: clear to auscultation bilaterally Breasts: normal appearance, no masses or tenderness Heart: regular rate and rhythm Abdomen: soft, non-tender; bowel sounds normal; no masses,  no organomegaly Extremities: extremities normal, atraumatic, no cyanosis  or edema Skin: Skin color, texture, turgor normal. No rashes or lesions Lymph nodes: Cervical, supraclavicular, and axillary nodes normal. No abnormal inguinal nodes palpated Neurologic: Grossly normal   Pelvic: External genitalia:  no lesions              Urethra:  normal appearing urethra with no masses, tenderness or lesions              Bartholins and Skenes: normal                 Vagina: normal appearing vagina with normal color and discharge, no lesions              Cervix: no lesions              Pap taken: Yes.   Bimanual Exam:  Uterus:  normal size, contour, position, consistency, mobility, non-tender              Adnexa: normal adnexa and no mass,  fullness, tenderness               Rectovaginal: Confirms               Anus:  normal sphincter tone, no lesions  Chaperone, Terence Lux, CMA, was present for exam.  A:  Well Woman with normal exam PMP, no HRT Intermittent urinary urgency with occasional urinary incontinence Vaginal atrophic changes elevated lipids, followed by Dr. Tamala Julian ASCUS pap with neg HR HPV 2018  P:   Mammogram guidelines reviewed.  Doing 3D. Release for colonoscopy signed today Pelvic PT and possible caffeine/carbonation as cause of urinary symptoms discussed Lab work UTD with Dr. Garth Bigness Pap and HR HPV obtained today BMD 2018, normal Vaccines reviewed return annually or prn

## 2019-08-08 LAB — CYTOLOGY - PAP
Comment: NEGATIVE
High risk HPV: NEGATIVE

## 2019-08-10 DIAGNOSIS — H02834 Dermatochalasis of left upper eyelid: Secondary | ICD-10-CM | POA: Diagnosis not present

## 2019-08-10 DIAGNOSIS — H02831 Dermatochalasis of right upper eyelid: Secondary | ICD-10-CM | POA: Diagnosis not present

## 2019-08-10 DIAGNOSIS — H2513 Age-related nuclear cataract, bilateral: Secondary | ICD-10-CM | POA: Diagnosis not present

## 2019-08-10 DIAGNOSIS — H52203 Unspecified astigmatism, bilateral: Secondary | ICD-10-CM | POA: Diagnosis not present

## 2019-09-20 DIAGNOSIS — R899 Unspecified abnormal finding in specimens from other organs, systems and tissues: Secondary | ICD-10-CM | POA: Diagnosis not present

## 2019-09-20 DIAGNOSIS — E870 Hyperosmolality and hypernatremia: Secondary | ICD-10-CM | POA: Diagnosis not present

## 2019-10-01 DIAGNOSIS — H02834 Dermatochalasis of left upper eyelid: Secondary | ICD-10-CM | POA: Diagnosis not present

## 2019-10-01 DIAGNOSIS — H02831 Dermatochalasis of right upper eyelid: Secondary | ICD-10-CM | POA: Diagnosis not present

## 2019-10-01 DIAGNOSIS — H02413 Mechanical ptosis of bilateral eyelids: Secondary | ICD-10-CM | POA: Diagnosis not present

## 2019-10-01 DIAGNOSIS — H02423 Myogenic ptosis of bilateral eyelids: Secondary | ICD-10-CM | POA: Diagnosis not present

## 2019-10-08 DIAGNOSIS — H53483 Generalized contraction of visual field, bilateral: Secondary | ICD-10-CM | POA: Diagnosis not present

## 2019-11-02 ENCOUNTER — Ambulatory Visit: Payer: BLUE CROSS/BLUE SHIELD | Admitting: Obstetrics & Gynecology

## 2019-11-02 DIAGNOSIS — H0015 Chalazion left lower eyelid: Secondary | ICD-10-CM | POA: Diagnosis not present

## 2019-11-10 ENCOUNTER — Ambulatory Visit: Payer: BC Managed Care – PPO | Attending: Internal Medicine

## 2019-11-10 DIAGNOSIS — Z23 Encounter for immunization: Secondary | ICD-10-CM

## 2019-11-10 NOTE — Progress Notes (Signed)
   Covid-19 Vaccination Clinic  Name:  April Maddox    MRN: [id number] DOB: 19-Sep-1955  11/10/2019  [name] was observed post Covid-19 immunization for 15 minutes without incident. She was provided with Vaccine Information Sheet and instruction to access the V-Safe system.   [name] was instructed to call 911 with any severe reactions post vaccine: Marland Kitchen Difficulty breathing  . Swelling of face and throat  . A fast heartbeat  . A bad rash all over body  . Dizziness and weakness   Immunizations Administered    Name Date Dose VIS Date Route   Pfizer COVID-19 Vaccine 11/10/2019  3:53 PM 0.3 mL 07/06/2019 Intramuscular   Manufacturer: [place]   Lot: [id number]   [place]: [id number]

## 2019-12-03 ENCOUNTER — Ambulatory Visit: Payer: BC Managed Care – PPO | Attending: Internal Medicine

## 2020-01-03 DIAGNOSIS — L814 Other melanin hyperpigmentation: Secondary | ICD-10-CM | POA: Diagnosis not present

## 2020-01-03 DIAGNOSIS — D225 Melanocytic nevi of trunk: Secondary | ICD-10-CM | POA: Diagnosis not present

## 2020-01-03 DIAGNOSIS — Z85828 Personal history of other malignant neoplasm of skin: Secondary | ICD-10-CM | POA: Diagnosis not present

## 2020-01-03 DIAGNOSIS — L718 Other rosacea: Secondary | ICD-10-CM | POA: Diagnosis not present

## 2020-02-11 DIAGNOSIS — H02413 Mechanical ptosis of bilateral eyelids: Secondary | ICD-10-CM | POA: Diagnosis not present

## 2020-02-11 DIAGNOSIS — H02834 Dermatochalasis of left upper eyelid: Secondary | ICD-10-CM | POA: Diagnosis not present

## 2020-02-11 DIAGNOSIS — H02423 Myogenic ptosis of bilateral eyelids: Secondary | ICD-10-CM | POA: Diagnosis not present

## 2020-02-11 DIAGNOSIS — H02831 Dermatochalasis of right upper eyelid: Secondary | ICD-10-CM | POA: Diagnosis not present

## 2020-02-27 ENCOUNTER — Other Ambulatory Visit: Payer: Self-pay | Admitting: Family Medicine

## 2020-02-27 DIAGNOSIS — Z1231 Encounter for screening mammogram for malignant neoplasm of breast: Secondary | ICD-10-CM

## 2020-04-11 ENCOUNTER — Other Ambulatory Visit: Payer: Self-pay

## 2020-04-11 ENCOUNTER — Ambulatory Visit
Admission: RE | Admit: 2020-04-11 | Discharge: 2020-04-11 | Disposition: A | Discharge status: Home / Self Care | Payer: BC Managed Care – PPO | Source: Ambulatory Visit | Attending: Family Medicine | Admitting: Family Medicine

## 2020-04-11 DIAGNOSIS — Z1231 Encounter for screening mammogram for malignant neoplasm of breast: Secondary | ICD-10-CM

## 2020-06-29 NOTE — Progress Notes (Signed)
Cardiology Office Note   Date:  06/30/2020   ID:  April Maddox, DOB 08/25/1955, MRN [id number]  PCP:  Carol Ada, MD  Cardiologist:   No primary care provider on file. Referring:  Carol Ada, MD  Chief Complaint  Patient presents with  . Palpitations      History of Present Illness: April Maddox is a 64 y.o. female who is referred by Carol Ada, MD for evaluation of multiple cardiovascular risk factors, palpitations and SOB.  The patient has not had any prior cardiac history.  Her mother did have a fatal myocardial infarction in her late 52s however.  The patient has not had any screening.  She does have dyslipidemia.  She is been having palpitations.  This happens sporadically.  It feels like a flipping or butterflies in her chest.  This might happen for seconds or for many minutes at a time.  It might wake her from sleep.  It happens at rest.  She cannot bring it on with activity.  It goes away spontaneously.  She does not describe any presyncope or syncope.  There is no chest pressure, neck or arm discomfort.  She has had no new PND or orthopnea.  He has had no weight gain or edema.  She does have some shortness of breath with activities but is doing aerobics.  Past Medical History:  Diagnosis Date  . Dyslipidemia   . 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 CERVICAL BIOPSY  W/ LOOP ELECTRODE EXCISION    . GYNECOLOGIC CRYOSURGERY       Current Outpatient Medications  Medication Sig Dispense Refill  . Ascorbic Acid (VITAMIN C) 1000 MG tablet Take 1,000 mg by mouth daily.    . Cholecalciferol (VITAMIN D3 PO) Take 1,000 Units by mouth.    Marland Kitchen Co-Enzyme Q-10 30 MG CAPS Take 30 mg by mouth daily.    . diphenhydrAMINE HCl (ALLERGY MED PO) Take by mouth daily.    . fluticasone (FLONASE) 50 MCG/ACT nasal spray Place into both nostrils daily.    . Multiple Vitamin (MULTIVITAMIN) capsule Take by  mouth daily.    . rosuvastatin (CRESTOR) 10 MG tablet Take 10 mg by mouth daily.    . Zinc 50 MG TABS Take by mouth.     No current facility-administered medications for this visit.    Allergies:   Patient has no known allergies.    Social History:  The patient  reports that she quit smoking about 39 years ago. Her smoking use included cigarettes. She has never used smokeless tobacco. She reports current alcohol use of about 5.0 standard drinks of alcohol per week. She reports that she does not use drugs.   Family History:  The patient's family history includes Heart attack (age of onset: 22) in her mother; Heart disease in her sister; Stroke in her father.    ROS:  Please see the history of present illness.   Otherwise, review of systems are positive for none.   All other systems are reviewed and negative.    PHYSICAL EXAM: VS:  BP 130/72   Pulse 91   Ht 5\' 3"  (1.6 m)   Wt 143 lb (64.9 kg)   SpO2 96%   BMI 25.33 kg/m  , BMI Body mass index is 25.33 kg/m. GENERAL:  Well appearing HEENT:  Pupils equal round and reactive, fundi not visualized, oral mucosa unremarkable NECK:  No jugular  venous distention, waveform within normal limits, carotid upstroke brisk and symmetric, no bruits, no thyromegaly LYMPHATICS:  No cervical, inguinal adenopathy LUNGS:  Clear to auscultation bilaterally BACK:  No CVA tenderness CHEST:  Unremarkable HEART:  PMI not displaced or sustained,S1 and S2 within normal limits, no S3, no S4, no clicks, no rubs, no murmurs ABD:  Flat, positive bowel sounds normal in frequency in pitch, no bruits, no rebound, no guarding, no midline pulsatile mass, no hepatomegaly, no splenomegaly EXT:  2 plus pulses throughout, no edema, no cyanosis no clubbing SKIN:  No rashes no nodules NEURO:  Cranial nerves II through XII grossly intact, motor grossly intact throughout PSYCH:  Cognitively intact, oriented to person place and time    EKG:  EKG is ordered today. The ekg  ordered today demonstrates sinus rhythm, rate 91, axis within normal limits, intervals within normal limits, no acute ST-T wave changes.   Recent Labs: No results found for requested labs within last 8760 hours.    Lipid Panel No results found for: CHOL, TRIG, HDL, CHOLHDL, VLDL, LDLCALC, LDLDIRECT    Wt Readings from Last 3 Encounters:  06/30/20 143 lb (64.9 kg)  08/06/19 140 lb 6.4 oz (63.7 kg)  06/26/18 142 lb 3.2 oz (64.5 kg)      Other studies Reviewed: Additional studies/ records that were reviewed today include: Labs. Review of the above records demonstrates:  Please see elsewhere in the note.     ASSESSMENT AND PLAN:  PALPITATIONS: I suspect PACs.  We talked about caffeine which she does not use very much.  I will look with her primary care physician to see if she had recent thyroid or basic metabolic profile and if not I will order these.  She will have a 2-week Zio patch.  SOB: She does have some shortness of breath with multiple cardiovascular risk factors.  I would like to screen her with a coronary calcium score.  This will also help [place] identify her goals of therapy for her lipids.   Current medicines are reviewed at length with the patient today.  The patient does not have concerns regarding medicines.  The following changes have been made:  no change  Labs/ tests ordered today include:   Orders Placed This Encounter  Procedures  . CT CARDIAC SCORING  . Basic metabolic panel  . TSH  . LONG TERM MONITOR (3-14 DAYS)  . EKG 12-Lead     Disposition:   FU with me as needed   Signed, Minus Breeding, MD  06/30/2020 10:56 AM    [place]

## 2020-06-30 ENCOUNTER — Encounter: Payer: Self-pay | Admitting: *Deleted

## 2020-06-30 ENCOUNTER — Other Ambulatory Visit: Payer: Self-pay

## 2020-06-30 ENCOUNTER — Encounter: Payer: Self-pay | Admitting: Cardiology

## 2020-06-30 ENCOUNTER — Ambulatory Visit (INDEPENDENT_AMBULATORY_CARE_PROVIDER_SITE_OTHER): Payer: BC Managed Care – PPO | Admitting: Cardiology

## 2020-06-30 VITALS — BP 130/72 | HR 91 | Ht 63.0 in | Wt 143.0 lb

## 2020-06-30 DIAGNOSIS — R0602 Shortness of breath: Secondary | ICD-10-CM

## 2020-06-30 DIAGNOSIS — R002 Palpitations: Secondary | ICD-10-CM | POA: Diagnosis not present

## 2020-06-30 NOTE — Patient Instructions (Addendum)
Medication Instructions:  No changes *If you need a refill on your cardiac medications before your next appointment, please call your pharmacy*  Lab Work: Your physician recommends that you return for lab work today (Thyroid, BMP)  Testing/Procedures: EKG  Zio Heart Monitor for 2 weeks  Coronary Calcium Score  Follow-Up: At [facility], you and your health needs are our priority.  As part of our continuing mission to provide you with exceptional heart care, we have created designated Provider Care Teams.  These Care Teams include your primary Cardiologist (physician) and Advanced Practice Providers (APPs -  Physician Assistants and Nurse Practitioners) who all work together to provide you with the care you need, when you need it.  Your next appointment:   Follow up as needed  Other Instructions ZIO XT- Long Term Monitor Instructions   Your physician has requested you wear your ZIO patch monitor_14_days.   This is a single patch monitor.  [employer] supplies one patch monitor per enrollment.  Additional stickers are not available.   Please do not apply patch if you will be having a Nuclear Stress Test, Echocardiogram, Cardiac CT, MRI, or Chest Xray during the time frame you would be wearing the monitor. The patch cannot be worn during these tests.  You cannot remove and re-apply the ZIO XT patch monitor.   Your ZIO patch monitor will be sent USPS Priority mail from [facility] directly to your home address. The monitor may also be mailed to a PO BOX if home delivery is not available.   It may take 3-5 days to receive your monitor after you have been enrolled.   Once you have received you monitor, please review enclosed instructions.  Your monitor has already been registered assigning a specific monitor serial # to you.   Applying the monitor   Shave hair from upper left chest.   Hold abrader disc by orange tab.  Rub abrader in 40 strokes over left upper chest as indicated  in your monitor instructions.   Clean area with 4 enclosed alcohol pads .  Use all pads to assure are is cleaned thoroughly.  Let dry.   Apply patch as indicated in monitor instructions.  Patch will be place under collarbone on left side of chest with arrow pointing upward.   Rub patch adhesive wings for 2 minutes.Remove white label marked "1".  Remove white label marked "2".  Rub patch adhesive wings for 2 additional minutes.   While looking in a mirror, press and release button in center of patch.  A small green light will flash 3-4 times .  This will be your only indicator the monitor has been turned on.     Do not shower for the first 24 hours.  You may shower after the first 24 hours.   Press button if you feel a symptom. You will hear a small click.  Record Date, Time and Symptom in the Patient Log Book.   When you are ready to remove patch, follow instructions on last 2 pages of Patient Log Book.  Stick patch monitor onto last page of Patient Log Book.   Place Patient Log Book in [place] box.  Use locking tab on box and tape box closed securely.  The [employer] and [employer] has [employer] on it.  Please place in mailbox as soon as possible.  Your physician should have your test results approximately 7 days after the monitor has been mailed back to [facility].   Call [employer]  Customer Care at [phone] if you have questions regarding your ZIO XT patch monitor.  Call them immediately if you see an orange light blinking on your monitor.   If your monitor falls off in less than 4 days contact our Monitor department at [phone].  If your monitor becomes loose or falls off after 4 days call [employer] at [phone] for suggestions on securing your monitor.      Coronary CalciumScan  Dr. Percival Spanish has ordered a CT coronary calcium score. This test is done at [address]. This is $150 out of pocket.  A coronary calcium scan is an imaging test used to  look for deposits of calcium and other fatty materials (plaques) in the inner lining of the blood vessels of the heart (coronary arteries). These deposits of calcium and plaques can partly clog and narrow the coronary arteries without producing any symptoms or warning signs. This puts a person at risk for a heart attack. This test can detect these deposits before symptoms develop. Tell a health care provider about:  Any allergies you have.  All medicines you are taking, including vitamins, herbs, eye drops, creams, and over-the-counter medicines.  Any problems you or family members have had with anesthetic medicines.  Any blood disorders you have.  Any surgeries you have had.  Any medical conditions you have.  Whether you are pregnant or may be pregnant. What are the risks? Generally, this is a safe procedure. However, problems may occur, including:  Harm to a pregnant woman and her unborn baby. This test involves the use of radiation. Radiation exposure can be dangerous to a pregnant woman and her unborn baby. If you are pregnant, you generally should not have this procedure done.  Slight increase in the risk of cancer. This is because of the radiation involved in the test. What happens before the procedure? No preparation is needed for this procedure. What happens during the procedure?  You will undress and remove any jewelry around your neck or chest.  You will put on a hospital gown.  Sticky electrodes will be placed on your chest. The electrodes will be connected to an electrocardiogram (ECG) machine to record a tracing of the electrical activity of your heart.  A CT scanner will take pictures of your heart. During this time, you will be asked to lie still and hold your breath for 2-3 seconds while a picture of your heart is being taken. The procedure may vary among health care providers and hospitals. What happens after the procedure?  You can get dressed.  You can return  to your normal activities.  It is up to you to get the results of your test. Ask your health care provider, or the department that is doing the test, when your results will be ready. Summary  A coronary calcium scan is an imaging test used to look for deposits of calcium and other fatty materials (plaques) in the inner lining of the blood vessels of the heart (coronary arteries).  Generally, this is a safe procedure. Tell your health care provider if you are pregnant or may be pregnant.  No preparation is needed for this procedure.  A CT scanner will take pictures of your heart.  You can return to your normal activities after the scan is done. This information is not intended to replace advice given to you by your health care provider. Make sure you discuss any questions you have with your health care provider. Document Released: 01/08/2008  Document Revised: 05/31/2016 Document Reviewed: 05/31/2016 Elsevier Interactive Patient Education  2017 [employer].

## 2020-06-30 NOTE — Progress Notes (Signed)
Patient ID: April Maddox, female   DOB: 01/29/1956, 64 y.o.   MRN: [id number] Patient enrolled for [employer] to ship a 14 day ZIO XT long term holter monitor to her home.

## 2020-07-01 LAB — BASIC METABOLIC PANEL
BUN/Creatinine Ratio: 23 (ref 12–28)
BUN: 18 mg/dL (ref 8–27)
CO2: 23 mmol/L (ref 20–29)
Calcium: 9.8 mg/dL (ref 8.7–10.3)
Chloride: 102 mmol/L (ref 96–106)
Creatinine, Ser: 0.8 mg/dL (ref 0.57–1.00)
GFR calc Af Amer: 90 mL/min/{1.73_m2} (ref >59)
GFR calc non Af Amer: 78 mL/min/{1.73_m2} (ref >59)
Glucose: 85 mg/dL (ref 65–99)
Potassium: 4.5 mmol/L (ref 3.5–5.2)
Sodium: 139 mmol/L (ref 134–144)

## 2020-07-01 LAB — TSH: TSH: 2.19 u[IU]/mL (ref 0.450–4.500)

## 2020-07-02 ENCOUNTER — Ambulatory Visit (INDEPENDENT_AMBULATORY_CARE_PROVIDER_SITE_OTHER): Payer: BC Managed Care – PPO

## 2020-07-02 DIAGNOSIS — R002 Palpitations: Secondary | ICD-10-CM | POA: Diagnosis not present

## 2020-07-07 DIAGNOSIS — H02834 Dermatochalasis of left upper eyelid: Secondary | ICD-10-CM | POA: Diagnosis not present

## 2020-07-07 DIAGNOSIS — H57813 Brow ptosis, bilateral: Secondary | ICD-10-CM | POA: Diagnosis not present

## 2020-07-07 DIAGNOSIS — H02831 Dermatochalasis of right upper eyelid: Secondary | ICD-10-CM | POA: Diagnosis not present

## 2020-07-23 ENCOUNTER — Inpatient Hospital Stay: Admission: RE | Admit: 2020-07-23 | Payer: BC Managed Care – PPO | Source: Ambulatory Visit

## 2020-07-23 DIAGNOSIS — R002 Palpitations: Secondary | ICD-10-CM | POA: Diagnosis not present

## 2020-08-04 ENCOUNTER — Other Ambulatory Visit: Payer: BC Managed Care – PPO

## 2020-08-05 DIAGNOSIS — Z0000 Encounter for general adult medical examination without abnormal findings: Secondary | ICD-10-CM | POA: Diagnosis not present

## 2020-08-07 ENCOUNTER — Ambulatory Visit: Payer: Self-pay

## 2020-08-08 DIAGNOSIS — Z0000 Encounter for general adult medical examination without abnormal findings: Secondary | ICD-10-CM | POA: Diagnosis not present

## 2020-08-08 DIAGNOSIS — E7800 Pure hypercholesterolemia, unspecified: Secondary | ICD-10-CM | POA: Diagnosis not present

## 2020-09-09 ENCOUNTER — Ambulatory Visit (INDEPENDENT_AMBULATORY_CARE_PROVIDER_SITE_OTHER)
Admission: RE | Admit: 2020-09-09 | Discharge: 2020-09-09 | Disposition: A | Discharge status: Home / Self Care | Payer: Self-pay | Source: Ambulatory Visit | Attending: Cardiology | Admitting: Cardiology

## 2020-09-09 ENCOUNTER — Other Ambulatory Visit: Payer: Self-pay

## 2020-09-09 DIAGNOSIS — R0602 Shortness of breath: Secondary | ICD-10-CM

## 2020-09-18 ENCOUNTER — Other Ambulatory Visit: Payer: Self-pay

## 2020-09-18 DIAGNOSIS — R931 Abnormal findings on diagnostic imaging of heart and coronary circulation: Secondary | ICD-10-CM

## 2020-09-18 NOTE — Progress Notes (Signed)
Exercise stress test ordered per Dr. Rosezella [place] result note for coronary calcium score.

## 2020-10-03 IMAGING — MG MM DIGITAL SCREENING BILAT W/ TOMO W/ CAD
6 of 10 series · 6 of 30 positions shown · non-contrast
Comparison: Previous exam(s).

CLINICAL DATA: Screening.

EXAM:
DIGITAL SCREENING BILATERAL MAMMOGRAM WITH TOMO AND CAD

[L MLO synth-2D (1 of 2)]
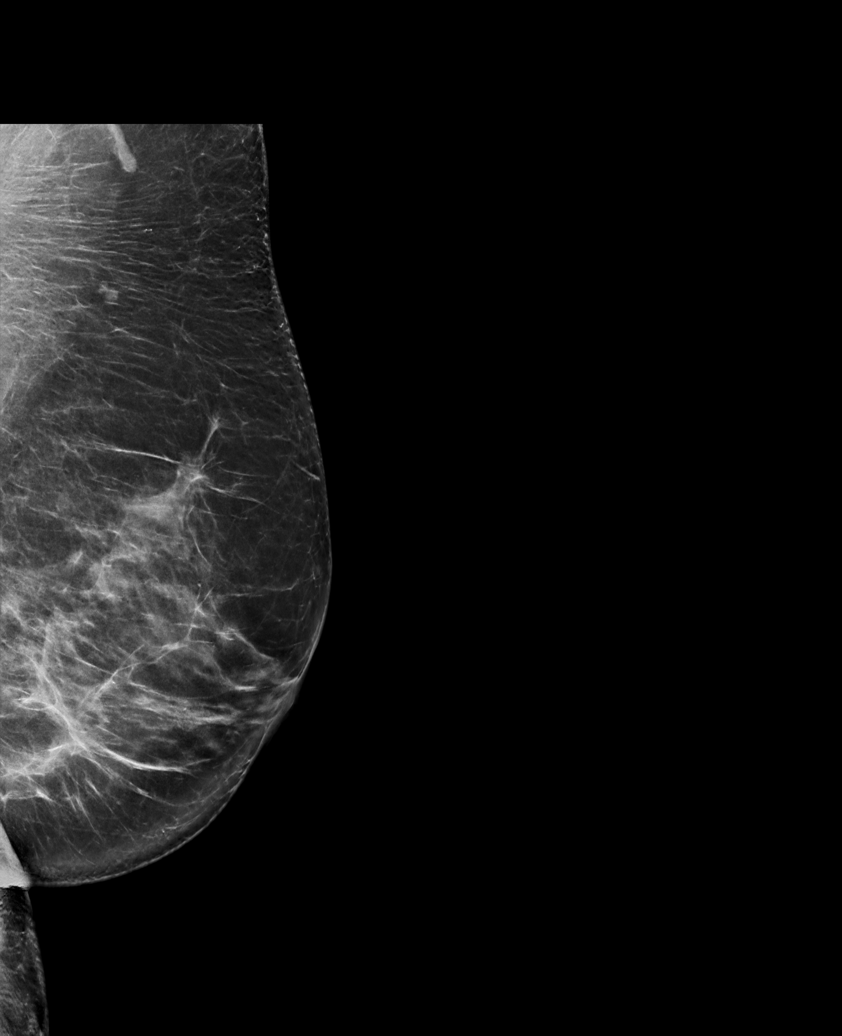

[L CC synth-2D]
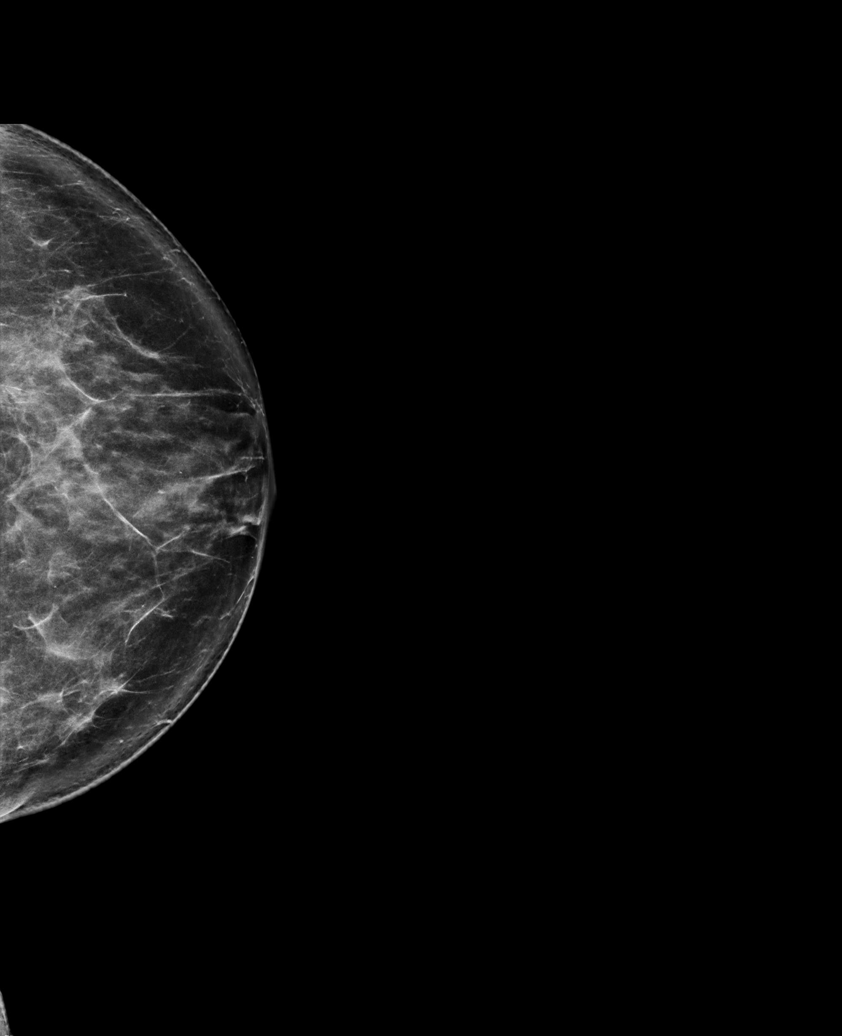

[L MLO synth-2D (2 of 2)]
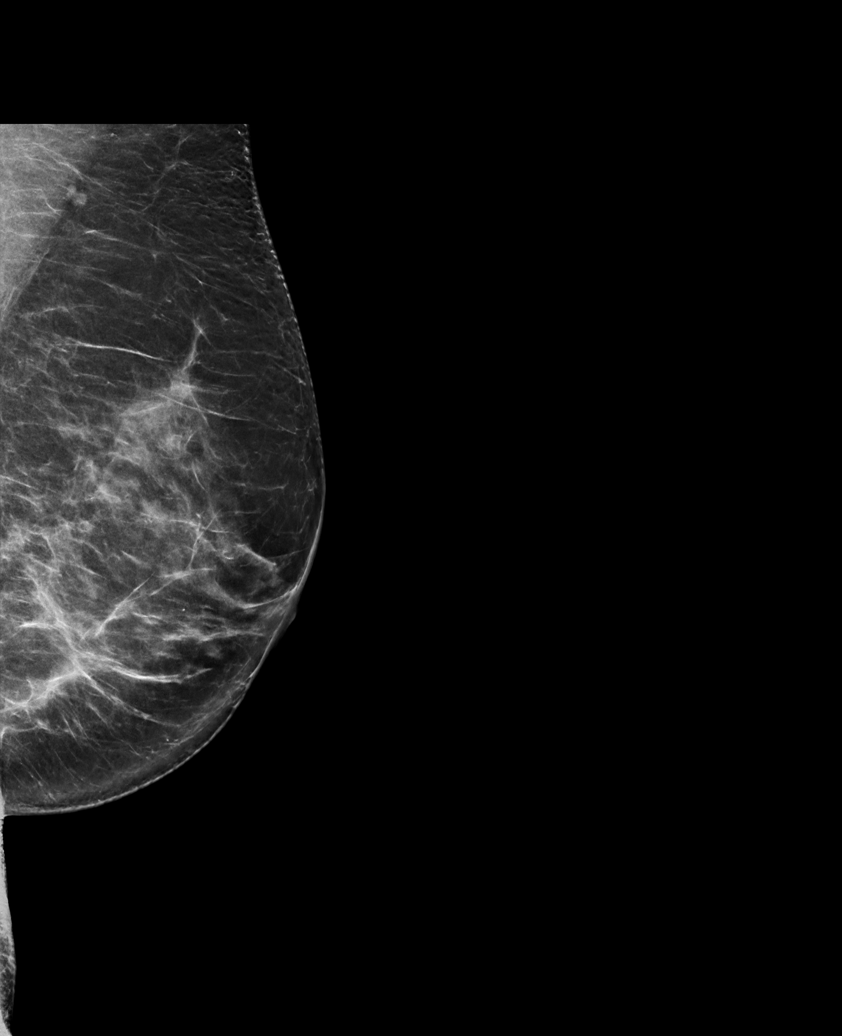

[R CC synth-2D]
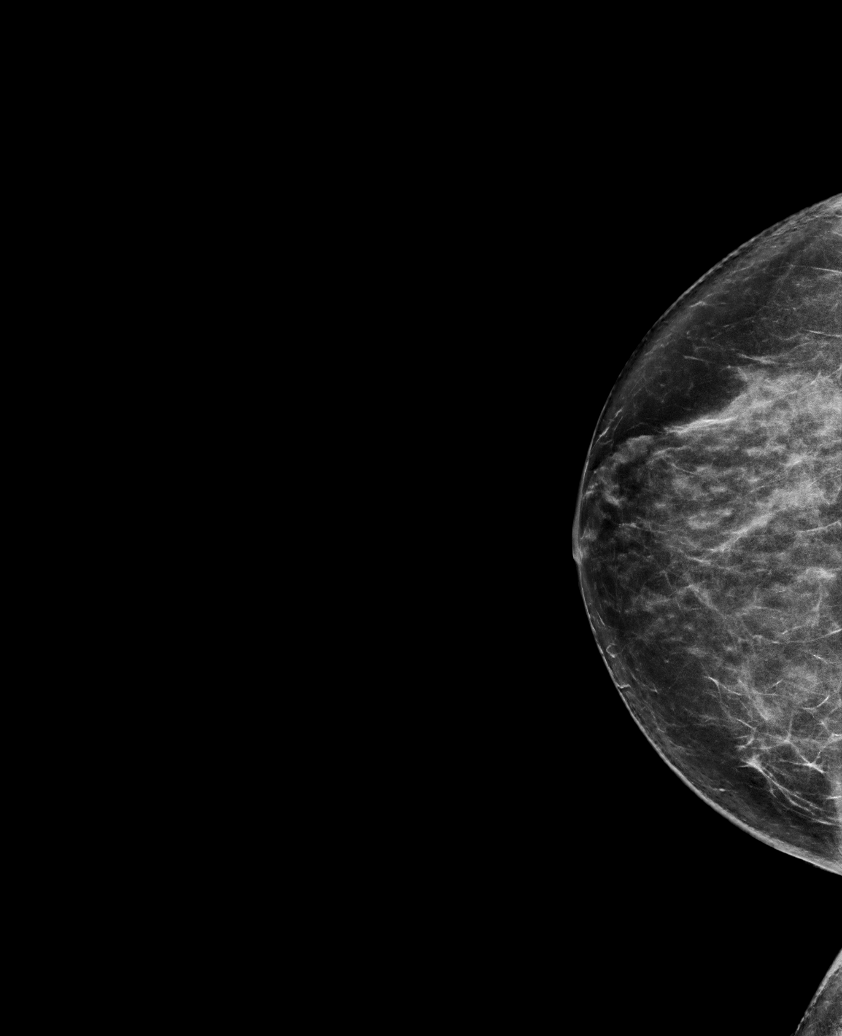

[R MLO synth-2D]
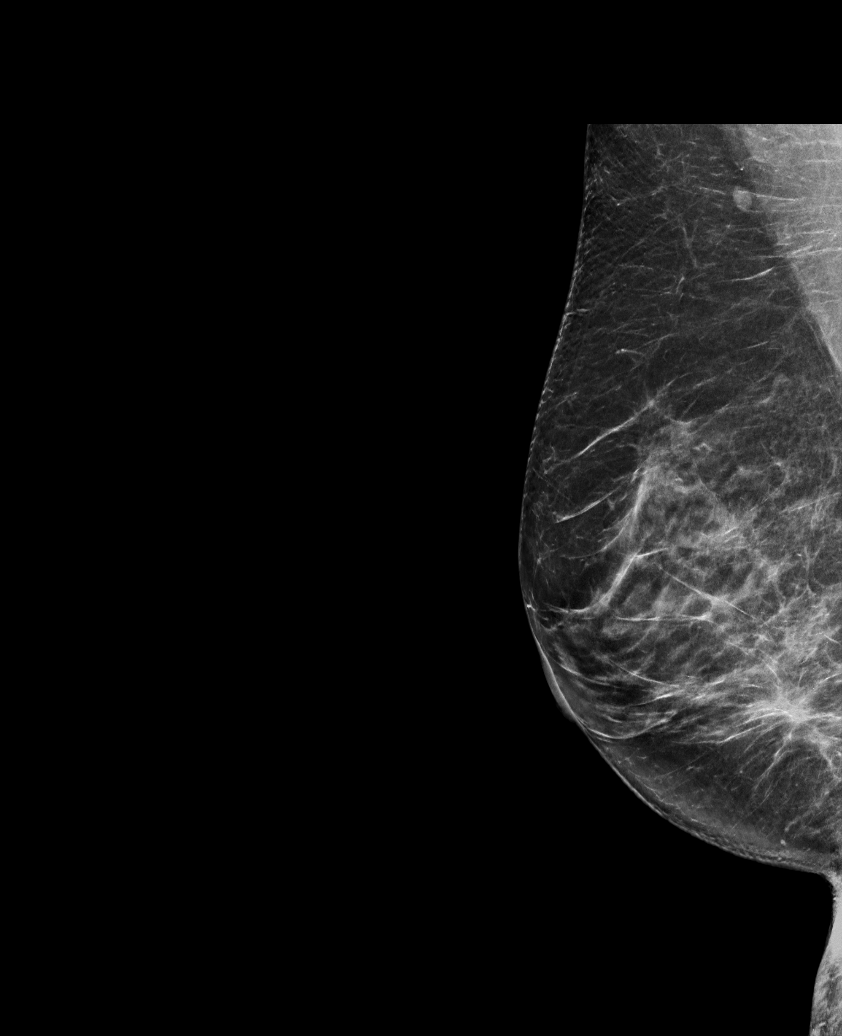

[R MLO tomo · tomo slice 38/75.0]
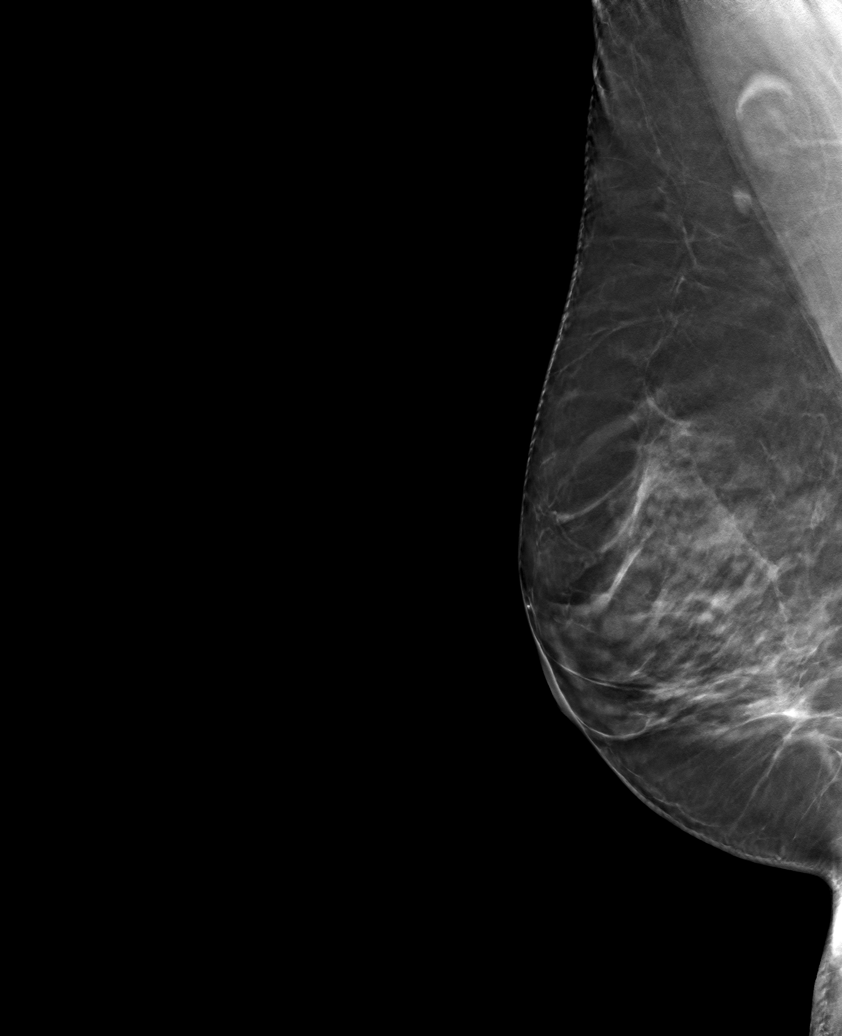

[6 of 30 positions shown; findings below may reference images not displayed]

ACR Breast Density Category c: The breast tissue is heterogeneously
dense, which may obscure small masses.
FINDINGS: There are no findings suspicious for malignancy. Images were
processed with CAD.
IMPRESSION: No mammographic evidence of malignancy. A result letter of this
screening mammogram will be mailed directly to the patient.

RECOMMENDATION:
Screening mammogram in one year. (Code:[id number]-U-LHB)

BI-RADS CATEGORY  1: Negative.

## 2020-10-23 ENCOUNTER — Ambulatory Visit: Payer: BC Managed Care – PPO

## 2020-10-29 ENCOUNTER — Other Ambulatory Visit (HOSPITAL_COMMUNITY): Payer: BC Managed Care – PPO

## 2020-10-31 ENCOUNTER — Encounter (HOSPITAL_COMMUNITY): Payer: BC Managed Care – PPO

## 2021-01-16 ENCOUNTER — Telehealth: Payer: Self-pay | Admitting: Cardiology

## 2021-01-16 NOTE — Telephone Encounter (Signed)
New Message:     Pt wants to know if her diagnosis after wearing the Monitor, is considered a  Pre Existing Condition? She needs to know this as she searchs for [employer] insurances.

## 2021-02-26 ENCOUNTER — Other Ambulatory Visit: Payer: Self-pay | Admitting: Family Medicine

## 2021-02-26 ENCOUNTER — Other Ambulatory Visit: Payer: Self-pay

## 2021-02-26 DIAGNOSIS — Z1231 Encounter for screening mammogram for malignant neoplasm of breast: Secondary | ICD-10-CM

## 2021-03-05 ENCOUNTER — Ambulatory Visit (INDEPENDENT_AMBULATORY_CARE_PROVIDER_SITE_OTHER): Payer: BC Managed Care – PPO | Admitting: Obstetrics & Gynecology

## 2021-03-05 ENCOUNTER — Other Ambulatory Visit: Payer: Self-pay

## 2021-03-05 ENCOUNTER — Encounter (HOSPITAL_BASED_OUTPATIENT_CLINIC_OR_DEPARTMENT_OTHER): Payer: Self-pay | Admitting: Obstetrics & Gynecology

## 2021-03-05 ENCOUNTER — Other Ambulatory Visit (HOSPITAL_COMMUNITY)
Admission: RE | Admit: 2021-03-05 | Discharge: 2021-03-05 | Disposition: A | Discharge status: Home / Self Care | Payer: BC Managed Care – PPO | Source: Ambulatory Visit | Attending: Obstetrics & Gynecology | Admitting: Obstetrics & Gynecology

## 2021-03-05 VITALS — BP 134/80 | HR 80 | Ht 63.0 in | Wt 140.6 lb

## 2021-03-05 DIAGNOSIS — Z01419 Encounter for gynecological examination (general) (routine) without abnormal findings: Secondary | ICD-10-CM | POA: Diagnosis not present

## 2021-03-05 DIAGNOSIS — Z1151 Encounter for screening for human papillomavirus (HPV): Secondary | ICD-10-CM | POA: Diagnosis present

## 2021-03-05 DIAGNOSIS — R87612 Low grade squamous intraepithelial lesion on cytologic smear of cervix (LGSIL): Secondary | ICD-10-CM

## 2021-03-05 DIAGNOSIS — Z780 Asymptomatic menopausal state: Secondary | ICD-10-CM | POA: Diagnosis not present

## 2021-03-05 DIAGNOSIS — N952 Postmenopausal atrophic vaginitis: Secondary | ICD-10-CM

## 2021-03-05 DIAGNOSIS — Z01411 Encounter for gynecological examination (general) (routine) with abnormal findings: Secondary | ICD-10-CM | POA: Diagnosis present

## 2021-03-05 DIAGNOSIS — Z8742 Personal history of other diseases of the female genital tract: Secondary | ICD-10-CM

## 2021-03-05 NOTE — Progress Notes (Signed)
65 y.o. [id number] Single White or Caucasian female here for annual exam.  Doing well.  Denies vaginal bleeding.  Sees Dr. Tamala Julian.  Lab work done early this year.  Cholesterol was good.  Has coronary CT with some increased calcium.  She is on crestor.  Stress test recommended but advised pt could wait until she goes on Medicare.    No LMP recorded. Patient is postmenopausal.          Sexually active: No.  The current method of family planning is post menopausal status.    Exercising: yes, regular floor exercises and jazzercise  Smoker:  no  Health Maintenance: Pap:  08/06/2019 LSIL History of abnormal Pap:  yes, h/o LEEP  between 2008 and 2015 MMG:  04/11/2020 Negative Colonoscopy:  10/12/2018 BMD:   03/08/2017 WNL TDaP:  2013 Pneumonia vaccine(s):  not indicated Shingrix:   completed Hep C testing: 2019 Screening Labs: Dr. Hal Hope   reports that she quit smoking about 40 years ago. Her smoking use included cigarettes. She has never used smokeless tobacco. She reports current alcohol use of about 5.0 standard drinks per week. She reports that she does not use drugs.  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     Bacillus Coagulans-Inulin (PROBIOTIC) 1-250 BILLION-MG CAPS Take by mouth.     Cholecalciferol (VITAMIN D3 PO) Take 1,000 Units by mouth.     Co-Enzyme Q-10 30 MG CAPS Take 30 mg by mouth daily.     diphenhydrAMINE HCl (ALLERGY MED PO) Take by mouth daily.     fluticasone (FLONASE) 50 MCG/ACT nasal spray Place into both nostrils daily.     Multiple Vitamin (MULTIVITAMIN) capsule Take by mouth daily.     rosuvastatin (CRESTOR) 10 MG tablet Take 10  mg by mouth daily.     Zinc 50 MG TABS Take by mouth.     No current facility-administered medications for this visit.    Family History  Problem Relation Age of Onset   Heart attack Mother 92   Stroke Father    Heart disease Sister        Palpitations   Breast cancer Neg Hx     Review of Systems  Genitourinary:        Mild incontinence  All other systems reviewed and are negative.  Exam:   BP 134/80 (BP Location: Right Arm, Patient Position: Sitting, Cuff Size: Small)   Pulse 80   Ht '5\' 3"'$  (1.6 m)   Wt 140 lb 9.6 oz (63.8 kg)   BMI 24.91 kg/m   Height: '5\' 3"'$  (160 cm)  General appearance: alert, cooperative and appears stated age Head: Normocephalic, without obvious abnormality, atraumatic Neck: no adenopathy, supple, symmetrical, trachea midline and thyroid normal to inspection and palpation Lungs: clear to auscultation bilaterally Breasts: normal appearance, no masses or tenderness Heart: regular rate and rhythm Abdomen: soft, non-tender; bowel sounds normal; no masses,  no organomegaly Extremities: extremities normal, atraumatic, no cyanosis or edema Skin: Skin color, texture, turgor normal. No rashes or lesions Lymph nodes: Cervical, supraclavicular, and axillary nodes normal. No abnormal inguinal nodes palpated Neurologic: Grossly normal  Pelvic: External genitalia:  no lesions              Urethra:  normal appearing urethra with no masses, tenderness or lesions              Bartholins and Skenes: normal                 Vagina: normal appearing vagina with normal color and no discharge, no lesions              Cervix: no lesions              Pap taken: Yes.   Bimanual Exam:  Uterus:  normal size, contour, position, consistency, mobility, non-tender              Adnexa: normal adnexa and no mass, fullness, tenderness               Rectovaginal: Confirms               Anus:  normal sphincter tone, no lesions  Chaperone, Octaviano Batty, CMA, was present for  exam.  Assessment/Plan: 1. Well woman exam with routine gynecological exam - pap and HR HPV Obtained today - MMG up to date - colonoscopy 2020 - BMD due next year - vaccines reviewed - lab work done with Dr. Tamala Julian  2. Postmenopausal - no HRT  3. LGSIL on Pap smear of cervix - Cytology - PAP( [place])  4. Vaginal atrophy  5. History of abnormal cervical Pap smear

## 2021-03-11 ENCOUNTER — Telehealth (HOSPITAL_COMMUNITY): Payer: Self-pay | Admitting: Cardiology

## 2021-03-11 LAB — CYTOLOGY - PAP
Comment: NEGATIVE
High risk HPV: NEGATIVE

## 2021-03-11 NOTE — Telephone Encounter (Signed)
Patient cancelled GXT for reason below:  pt cancelled per My Chart for reason:Thank you so much.   I'll reschedule when I get on Medicare.   Thanks for understanding! Order will be removed from the [place] and when patient calls back to reschedule we will reinstate the order or create a new one. Thank you

## 2021-03-26 ENCOUNTER — Telehealth: Payer: Self-pay | Admitting: Cardiology

## 2021-03-26 NOTE — Telephone Encounter (Signed)
Spoke with pt, she reports after eating she developed palpitations that will usually then she woke with the  stop with coughing but is was prolonged longer than normal. They lasted for 1 minute. She then woke during the night with the palpitations and she had a twinge of pain in her chest with it. This morning she feels fine. She is worried about having a heart attack. Reassurance given to the patient and val salva maneuvers discussed with the patient. Avoiding stimulants also discussed.

## 2021-03-26 NOTE — Telephone Encounter (Signed)
Patient c/o Palpitations:  High priority if patient c/o lightheadedness, shortness of breath, or chest pain  How long have you had palpitations/irregular HR/ Afib? Are you having the symptoms now?  Last night at dinner and late  last night   currently experiencing lightheadedness, SOB or CP? Twinge of a pain last night  Do you have a history of afib (atrial fibrillation) or irregular heart rhythm? yes  Have you checked your BP or HR? (document readings if available):  did not check  Are you experiencing any other symptoms? No- patient was wondering if she needs to be seen- going out of town tomorrow

## 2021-04-01 DIAGNOSIS — K5792 Diverticulitis of intestine, part unspecified, without perforation or abscess without bleeding: Secondary | ICD-10-CM | POA: Diagnosis not present

## 2021-04-17 ENCOUNTER — Telehealth (HOSPITAL_COMMUNITY): Payer: Self-pay | Admitting: *Deleted

## 2021-04-17 NOTE — Telephone Encounter (Signed)
Close encounter 

## 2021-04-21 ENCOUNTER — Other Ambulatory Visit: Payer: Self-pay

## 2021-04-21 ENCOUNTER — Ambulatory Visit
Admission: RE | Admit: 2021-04-21 | Discharge: 2021-04-21 | Disposition: A | Discharge status: Home / Self Care | Payer: Medicare Other | Source: Ambulatory Visit | Attending: Family Medicine | Admitting: Family Medicine

## 2021-04-21 DIAGNOSIS — Z1231 Encounter for screening mammogram for malignant neoplasm of breast: Secondary | ICD-10-CM

## 2021-04-22 ENCOUNTER — Encounter (HOSPITAL_COMMUNITY): Payer: BC Managed Care – PPO

## 2021-04-23 ENCOUNTER — Other Ambulatory Visit: Payer: Self-pay

## 2021-04-23 ENCOUNTER — Ambulatory Visit (HOSPITAL_COMMUNITY)
Admission: RE | Admit: 2021-04-23 | Discharge: 2021-04-23 | Disposition: A | Discharge status: Home / Self Care | Payer: Medicare Other | Source: Ambulatory Visit | Attending: Cardiology | Admitting: Cardiology

## 2021-04-23 DIAGNOSIS — R931 Abnormal findings on diagnostic imaging of heart and coronary circulation: Secondary | ICD-10-CM | POA: Diagnosis not present

## 2021-04-23 LAB — EXERCISE TOLERANCE TEST
Estimated workload: 10.1
Exercise duration (min): 9 min
Exercise duration (sec): 0 s
MPHR: 156 {beats}/min
Peak HR: 157 {beats}/min
Percent HR: 101 %
Rest HR: 86 {beats}/min

## 2021-04-29 ENCOUNTER — Encounter: Payer: Self-pay | Admitting: *Deleted

## 2021-04-30 DIAGNOSIS — H5203 Hypermetropia, bilateral: Secondary | ICD-10-CM | POA: Diagnosis not present

## 2021-04-30 DIAGNOSIS — H2513 Age-related nuclear cataract, bilateral: Secondary | ICD-10-CM | POA: Diagnosis not present

## 2021-04-30 DIAGNOSIS — H52203 Unspecified astigmatism, bilateral: Secondary | ICD-10-CM | POA: Diagnosis not present

## 2022-01-19 ENCOUNTER — Other Ambulatory Visit: Payer: Self-pay | Admitting: Family Medicine

## 2022-01-19 DIAGNOSIS — Z1231 Encounter for screening mammogram for malignant neoplasm of breast: Secondary | ICD-10-CM

## 2022-03-15 ENCOUNTER — Other Ambulatory Visit (HOSPITAL_COMMUNITY)
Admission: RE | Admit: 2022-03-15 | Discharge: 2022-03-15 | Disposition: A | Discharge status: Home / Self Care | Payer: Medicare Other | Source: Ambulatory Visit | Attending: Obstetrics & Gynecology | Admitting: Obstetrics & Gynecology

## 2022-03-15 ENCOUNTER — Encounter (HOSPITAL_BASED_OUTPATIENT_CLINIC_OR_DEPARTMENT_OTHER): Payer: Self-pay | Admitting: Obstetrics & Gynecology

## 2022-03-15 ENCOUNTER — Ambulatory Visit (INDEPENDENT_AMBULATORY_CARE_PROVIDER_SITE_OTHER): Payer: Medicare Other | Admitting: Obstetrics & Gynecology

## 2022-03-15 VITALS — BP 137/83 | HR 77 | Wt 139.0 lb

## 2022-03-15 DIAGNOSIS — Z01419 Encounter for gynecological examination (general) (routine) without abnormal findings: Secondary | ICD-10-CM | POA: Diagnosis not present

## 2022-03-15 DIAGNOSIS — R87612 Low grade squamous intraepithelial lesion on cytologic smear of cervix (LGSIL): Secondary | ICD-10-CM | POA: Insufficient documentation

## 2022-03-15 DIAGNOSIS — Z1151 Encounter for screening for human papillomavirus (HPV): Secondary | ICD-10-CM | POA: Insufficient documentation

## 2022-03-15 DIAGNOSIS — Z13820 Encounter for screening for osteoporosis: Secondary | ICD-10-CM | POA: Diagnosis not present

## 2022-03-15 DIAGNOSIS — Z9289 Personal history of other medical treatment: Secondary | ICD-10-CM

## 2022-03-15 DIAGNOSIS — E2839 Other primary ovarian failure: Secondary | ICD-10-CM

## 2022-03-15 DIAGNOSIS — Z8742 Personal history of other diseases of the female genital tract: Secondary | ICD-10-CM

## 2022-03-15 NOTE — Patient Instructions (Addendum)
Last tdap that I have documented was 11/10/2021.

## 2022-03-15 NOTE — Progress Notes (Signed)
66 y.o. [id number] Single White or Caucasian female here for breast and pelvic exam.  I am also following her for history of abnormal pap smear.  Denies vaginal bleeding.  Seeing [facility], integrative provider.  Recommended she have bone density testing updated.  Order placed.  Height is lower this year.  No LMP recorded. Patient is postmenopausal.          Sexually active: No.  H/O STD:  no  Health Maintenance: PCP:  Carol Ada.  Last wellness appt was 07/2021.  Did blood work at that appt:  yes Vaccines are up to date:  pt aware tdap is due Colonoscopy:  01/21/2022 with Dr. Paulita Fujita, follow up 5 years MMG:  scheduled 04/22/2022 BMD:  ordered today Last pap smear:  2022, LGSIL pap with neg HR HPV.   H/o abnormal pap smear:  yes    reports that she quit smoking about 41 years ago. Her smoking use included cigarettes. She has never used smokeless tobacco. She reports current alcohol use of about 5.0 standard drinks of alcohol per week. She reports that she does not use drugs.  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     Ascorbic Acid (VITAMIN C) 1000 MG tablet Take 1,000 mg by mouth daily.     Bacillus Coagulans-Inulin (PROBIOTIC) 1-250 BILLION-MG CAPS Take by mouth.     Cholecalciferol (VITAMIN D3 PO) Take 1,000 Units by mouth.     Co-Enzyme Q-10 30 MG CAPS Take 30 mg by mouth daily.     diphenhydrAMINE HCl (ALLERGY MED PO) Take by mouth daily.     fluticasone (FLONASE) 50 MCG/ACT nasal spray Place into both nostrils daily.     Multiple Vitamin (MULTIVITAMIN) capsule Take by mouth daily.     rosuvastatin (CRESTOR) 10 MG tablet Take 10 mg by mouth daily.     No current  facility-administered medications for this visit.    Family History  Problem Relation Age of Onset   Heart attack Mother 36   Stroke Father    Heart disease Sister        Palpitations   Breast cancer Neg Hx     Review of Systems  Constitutional: Negative.   Genitourinary: Negative.     Exam:   BP 137/83   Pulse 77   Wt 139 lb (63 kg)   BMI 24.62 kg/m      General appearance: alert, cooperative and appears stated age Breasts: normal appearance, no masses or tenderness Abdomen: soft, non-tender; bowel sounds normal; no masses,  no organomegaly Lymph nodes: Cervical, supraclavicular, and axillary nodes normal.  No abnormal inguinal nodes palpated Neurologic: Grossly normal  Pelvic: External genitalia:  no lesions              Urethra:  normal appearing urethra with no masses, tenderness or lesions              Bartholins and Skenes: normal                 Vagina: normal appearing vagina with atrophic changes and no discharge, no lesions  Cervix: no lesions              Pap taken: No. Bimanual Exam:  Uterus:  normal size, contour, position, consistency, mobility, non-tender              Adnexa: normal adnexa and no mass, fullness, tenderness               Rectovaginal: Confirms               Anus:  normal sphincter tone, no lesions  Chaperone, Arlie Solomons, CMA, was present for exam.  Assessment/Plan: 1. Encntr for gyn exam (general) (routine) w/o abn findings - Pap smear with HR HPV obtained today - Mammogram scheduled 03/2022 - Colonoscopy done 12/2021 - Bone mineral density ordered - lab work done done with PCP - vaccines reviewed/updated.  Pt aware tdap is due.  2. Screening for osteoporosis - DG BONE DENSITY (DXA); Future  3. LGSIL on Pap smear of cervix - Cytology - PAP( [place])  4. Hypoestrogenism - DG BONE DENSITY (DXA); Future  5. History of abnormal cervical Pap smear

## 2022-03-23 ENCOUNTER — Ambulatory Visit
Admission: RE | Admit: 2022-03-23 | Discharge: 2022-03-23 | Disposition: A | Discharge status: Home / Self Care | Payer: Medicare Other | Source: Ambulatory Visit | Attending: Obstetrics & Gynecology | Admitting: Obstetrics & Gynecology

## 2022-03-23 DIAGNOSIS — E2839 Other primary ovarian failure: Secondary | ICD-10-CM

## 2022-03-23 DIAGNOSIS — Z13820 Encounter for screening for osteoporosis: Secondary | ICD-10-CM

## 2022-03-23 LAB — CYTOLOGY - PAP
Comment: NEGATIVE
High risk HPV: NEGATIVE

## 2022-04-22 ENCOUNTER — Ambulatory Visit: Payer: BC Managed Care – PPO

## 2022-04-23 ENCOUNTER — Ambulatory Visit
Admission: RE | Admit: 2022-04-23 | Discharge: 2022-04-23 | Disposition: A | Discharge status: Home / Self Care | Payer: Medicare Other | Source: Ambulatory Visit | Attending: Family Medicine | Admitting: Family Medicine

## 2022-04-23 DIAGNOSIS — Z1231 Encounter for screening mammogram for malignant neoplasm of breast: Secondary | ICD-10-CM

## 2022-05-19 ENCOUNTER — Ambulatory Visit: Payer: BC Managed Care – PPO

## 2023-02-18 ENCOUNTER — Telehealth: Payer: Medicare Other | Admitting: Family Medicine

## 2023-02-18 DIAGNOSIS — J069 Acute upper respiratory infection, unspecified: Secondary | ICD-10-CM | POA: Diagnosis not present

## 2023-02-18 MED ORDER — BENZONATATE 200 MG PO CAPS: 200.0000 mg | ORAL_CAPSULE | Freq: Two times a day (BID) | ORAL | 0 refills | Status: AC | PRN

## 2023-02-18 MED ORDER — FLUTICASONE PROPIONATE 50 MCG/ACT NA SUSP: 2.0000 | Freq: Every day | NASAL | 6 refills | Status: AC

## 2023-02-18 NOTE — Progress Notes (Signed)

## 2023-03-10 ENCOUNTER — Other Ambulatory Visit: Payer: Self-pay | Admitting: Family Medicine

## 2023-03-10 DIAGNOSIS — Z1231 Encounter for screening mammogram for malignant neoplasm of breast: Secondary | ICD-10-CM

## 2023-04-25 ENCOUNTER — Ambulatory Visit: Payer: Medicare Other

## 2023-05-27 ENCOUNTER — Ambulatory Visit
Admission: RE | Admit: 2023-05-27 | Discharge: 2023-05-27 | Disposition: A | Discharge status: Home / Self Care | Payer: Medicare Other | Source: Ambulatory Visit | Attending: Family Medicine | Admitting: Family Medicine

## 2023-05-27 DIAGNOSIS — Z1231 Encounter for screening mammogram for malignant neoplasm of breast: Secondary | ICD-10-CM

## 2024-04-12 ENCOUNTER — Other Ambulatory Visit: Payer: Self-pay | Admitting: Obstetrics & Gynecology

## 2024-04-12 DIAGNOSIS — Z1231 Encounter for screening mammogram for malignant neoplasm of breast: Secondary | ICD-10-CM

## 2024-05-28 ENCOUNTER — Ambulatory Visit
Admission: RE | Admit: 2024-05-28 | Discharge: 2024-05-28 | Disposition: A | Source: Ambulatory Visit | Attending: Obstetrics & Gynecology | Admitting: Obstetrics & Gynecology

## 2024-05-28 DIAGNOSIS — Z1231 Encounter for screening mammogram for malignant neoplasm of breast: Secondary | ICD-10-CM
